# Patient Record
Sex: Female | Born: 1960 | Race: White | Hispanic: No | Marital: Married | Smoking: Never smoker
Health system: Southern US, Community
[De-identification: ages and names within clinical notes are randomized; demographics above are authoritative.]

## PROBLEM LIST (undated history)

## (undated) DIAGNOSIS — N189 Chronic kidney disease, unspecified: Secondary | ICD-10-CM

## (undated) DIAGNOSIS — R112 Nausea with vomiting, unspecified: Secondary | ICD-10-CM

## (undated) DIAGNOSIS — IMO0001 Reserved for inherently not codable concepts without codable children: Secondary | ICD-10-CM

## (undated) DIAGNOSIS — K219 Gastro-esophageal reflux disease without esophagitis: Secondary | ICD-10-CM

## (undated) DIAGNOSIS — Z9889 Other specified postprocedural states: Secondary | ICD-10-CM

## (undated) DIAGNOSIS — G4730 Sleep apnea, unspecified: Secondary | ICD-10-CM

## (undated) DIAGNOSIS — R200 Anesthesia of skin: Secondary | ICD-10-CM

## (undated) DIAGNOSIS — Z8489 Family history of other specified conditions: Secondary | ICD-10-CM

## (undated) DIAGNOSIS — Z8709 Personal history of other diseases of the respiratory system: Secondary | ICD-10-CM

## (undated) DIAGNOSIS — R002 Palpitations: Secondary | ICD-10-CM

## (undated) DIAGNOSIS — I499 Cardiac arrhythmia, unspecified: Secondary | ICD-10-CM

## (undated) DIAGNOSIS — Z973 Presence of spectacles and contact lenses: Secondary | ICD-10-CM

## (undated) DIAGNOSIS — R202 Paresthesia of skin: Secondary | ICD-10-CM

## (undated) DIAGNOSIS — J302 Other seasonal allergic rhinitis: Secondary | ICD-10-CM

## (undated) DIAGNOSIS — Z8719 Personal history of other diseases of the digestive system: Secondary | ICD-10-CM

## (undated) DIAGNOSIS — Z9689 Presence of other specified functional implants: Secondary | ICD-10-CM

## (undated) DIAGNOSIS — E119 Type 2 diabetes mellitus without complications: Secondary | ICD-10-CM

## (undated) DIAGNOSIS — H9192 Unspecified hearing loss, left ear: Secondary | ICD-10-CM

## (undated) HISTORY — PX: ROTATOR CUFF REPAIR: SHX139

## (undated) HISTORY — PX: BACK SURGERY: SHX140

## (undated) HISTORY — PX: COLONOSCOPY: SHX174

## (undated) HISTORY — PX: PLANTAR FASCIA SURGERY: SHX746

## (undated) HISTORY — PX: ABDOMINAL HYSTERECTOMY: SHX81

## (undated) HISTORY — DX: Other seasonal allergic rhinitis: J30.2

## (undated) HISTORY — DX: Chronic kidney disease, unspecified: N18.9

## (undated) HISTORY — PX: OTHER SURGICAL HISTORY: SHX169

## (undated) HISTORY — PX: ESOPHAGOGASTRODUODENOSCOPY: SHX1529

---
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
## 1988-09-06 HISTORY — PX: CHOLECYSTECTOMY: SHX55

## 1996-09-06 DIAGNOSIS — Z5189 Encounter for other specified aftercare: Secondary | ICD-10-CM

## 1996-09-06 HISTORY — DX: Encounter for other specified aftercare: Z51.89

## 2003-10-25 ENCOUNTER — Ambulatory Visit (HOSPITAL_BASED_OUTPATIENT_CLINIC_OR_DEPARTMENT_OTHER): Admission: RE | Admit: 2003-10-25 | Discharge: 2003-10-25 | Payer: Self-pay | Admitting: Internal Medicine

## 2005-01-14 ENCOUNTER — Ambulatory Visit (HOSPITAL_COMMUNITY): Admission: RE | Admit: 2005-01-14 | Discharge: 2005-01-14 | Payer: Self-pay | Admitting: Orthopedic Surgery

## 2005-01-14 ENCOUNTER — Ambulatory Visit (HOSPITAL_BASED_OUTPATIENT_CLINIC_OR_DEPARTMENT_OTHER): Admission: RE | Admit: 2005-01-14 | Discharge: 2005-01-14 | Payer: Self-pay | Admitting: Orthopedic Surgery

## 2006-07-27 ENCOUNTER — Emergency Department (HOSPITAL_COMMUNITY): Admission: EM | Admit: 2006-07-27 | Discharge: 2006-07-27 | Payer: Self-pay | Admitting: Emergency Medicine

## 2007-07-27 ENCOUNTER — Emergency Department (HOSPITAL_COMMUNITY): Admission: EM | Admit: 2007-07-27 | Discharge: 2007-07-27 | Payer: Self-pay | Admitting: Emergency Medicine

## 2008-09-09 DIAGNOSIS — E119 Type 2 diabetes mellitus without complications: Secondary | ICD-10-CM | POA: Insufficient documentation

## 2008-09-09 DIAGNOSIS — E1129 Type 2 diabetes mellitus with other diabetic kidney complication: Secondary | ICD-10-CM | POA: Insufficient documentation

## 2008-09-09 DIAGNOSIS — R809 Proteinuria, unspecified: Secondary | ICD-10-CM | POA: Insufficient documentation

## 2008-09-10 ENCOUNTER — Ambulatory Visit: Payer: Self-pay | Admitting: Gastroenterology

## 2008-09-10 DIAGNOSIS — R112 Nausea with vomiting, unspecified: Secondary | ICD-10-CM | POA: Insufficient documentation

## 2008-09-10 DIAGNOSIS — R197 Diarrhea, unspecified: Secondary | ICD-10-CM | POA: Insufficient documentation

## 2008-09-10 DIAGNOSIS — R198 Other specified symptoms and signs involving the digestive system and abdomen: Secondary | ICD-10-CM | POA: Insufficient documentation

## 2008-09-10 DIAGNOSIS — R1013 Epigastric pain: Secondary | ICD-10-CM | POA: Insufficient documentation

## 2008-09-10 DIAGNOSIS — K219 Gastro-esophageal reflux disease without esophagitis: Secondary | ICD-10-CM | POA: Insufficient documentation

## 2008-09-11 LAB — CONVERTED CEMR LAB
ALT: 20 units/L (ref 0–35)
AST: 18 units/L (ref 0–37)
Albumin: 4.3 g/dL (ref 3.5–5.2)
Alkaline Phosphatase: 55 units/L (ref 39–117)
Basophils Absolute: 0.1 10*3/uL (ref 0.0–0.1)
Eosinophils Absolute: 0.2 10*3/uL (ref 0.0–0.7)
GFR calc Af Amer: 100 mL/min
GFR calc non Af Amer: 82 mL/min
Glucose, Bld: 122 mg/dL — ABNORMAL HIGH (ref 70–99)
HCT: 37.6 % (ref 36.0–46.0)
Lipase: 23 units/L (ref 11.0–59.0)
Lymphocytes Relative: 35.7 % (ref 12.0–46.0)
MCHC: 35.2 g/dL (ref 30.0–36.0)
Monocytes Relative: 6 % (ref 3.0–12.0)
Neutro Abs: 5.2 10*3/uL (ref 1.4–7.7)
Neutrophils Relative %: 55.6 % (ref 43.0–77.0)
Platelets: 256 10*3/uL (ref 150–400)
RDW: 12.2 % (ref 11.5–14.6)
Sodium: 141 meq/L (ref 135–145)

## 2008-09-27 ENCOUNTER — Telehealth: Payer: Self-pay | Admitting: Gastroenterology

## 2008-10-11 ENCOUNTER — Encounter: Payer: Self-pay | Admitting: Gastroenterology

## 2008-10-11 ENCOUNTER — Ambulatory Visit: Payer: Self-pay | Admitting: Gastroenterology

## 2008-10-14 ENCOUNTER — Encounter: Payer: Self-pay | Admitting: Gastroenterology

## 2010-12-22 LAB — GLUCOSE, CAPILLARY
Glucose-Capillary: 225 mg/dL — ABNORMAL HIGH (ref 70–99)
Glucose-Capillary: 239 mg/dL — ABNORMAL HIGH (ref 70–99)

## 2011-01-22 NOTE — Op Note (Signed)
Alexis Black, Alexis Black                 ACCOUNT NO.:  [id number]   MEDICAL RECORD NO.:  [id number]          PATIENT TYPE:  AMB   LOCATION:  [facility]                          FACILITY:  [facility]   PHYSICIAN:  Nadara Mustard, MD     DATE OF BIRTH:  07/17/1961   DATE OF PROCEDURE:  01/14/2005  DATE OF DISCHARGE:                                 OPERATIVE REPORT   PREOPERATIVE DIAGNOSIS:  Achilles contracture with plantar fasciitis, right  foot.   POSTOPERATIVE DIAGNOSIS:  Achilles contracture with plantar fasciitis, right  foot.   PROCEDURE:  Gastrocnemius recession, right leg.   SURGEON.:  Nadara Mustard, MD   ANESTHESIA:  Popliteal block plus general tracheal.   ESTIMATED BLOOD LOSS:  Minimal.   ANTIBIOTICS:  None.   DRAINS:  None.   COMPLICATIONS:  None.   DISPOSITION:  To PACU in stable condition.   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 The patient has failed conservative care with  activity modifications and stretching and presents at this time for a  gastrocnemius recession. The patient has dorsiflexion 20 degrees short of  neutral with her knee extended, does have dorsiflexion past neutral with her  knee flexed and presents at this time for the above-mentioned procedure. The  risks and benefits were discussed including infection, neurovascular injury,  recurrent contraction, need for additional surgery, injury of the sural  nerve. The patient states she understands and wished to proceed at this  time.   DESCRIPTION OF PROCEDURE:  The patient was brought to OR room five after a  popliteal block. The patient then underwent general anesthetic. After  adequate level of anesthesia obtained, the patient was placed prone on the  operating table and her right lower extremity was prepped using DuraPrep and  draped in a sterile field. A  2 inch  incision was made approximately a  centimeter proximal to the musculotendinous junction of the Achilles  gastrocnemius complex. Blunt dissection was carried down to the fascia of  the muscle. With the soft tissue protected, the fascia was released. This  was released medially and laterally. The patient went from -40 degrees of  dorsiflexion to +20 degrees of dorsiflexion with over 40 degrees of gain in  range of motion. The wound was irrigated with normal saline. There was no  bleeding. The wound was closed using running 3-0 intracuticular  Monocryl suture and Steri-Strips were used. The wound was covered with 4 x  4s, Adaptic, Kerlix and a Coban dressing. The patient was then transferred  supine, extubated, taken to PACU in stable condition. Plan for progressive  ambulation, weightbearing as tolerated, change of dressing in three days.  Follow-up in office in two weeks.      MVD/MEDQ  D:  01/14/2005  T:  01/14/2005  Job:  [id number]

## 2011-07-01 ENCOUNTER — Observation Stay (HOSPITAL_COMMUNITY)
Admission: EM | Admit: 2011-07-01 | Discharge: 2011-07-02 | Disposition: A | Discharge status: Home / Self Care | Payer: 59 | Attending: Emergency Medicine | Admitting: Emergency Medicine

## 2011-07-01 ENCOUNTER — Emergency Department (HOSPITAL_COMMUNITY): Payer: 59

## 2011-07-01 DIAGNOSIS — E119 Type 2 diabetes mellitus without complications: Secondary | ICD-10-CM | POA: Insufficient documentation

## 2011-07-01 DIAGNOSIS — Z8249 Family history of ischemic heart disease and other diseases of the circulatory system: Secondary | ICD-10-CM | POA: Insufficient documentation

## 2011-07-01 DIAGNOSIS — R0789 Other chest pain: Principal | ICD-10-CM | POA: Insufficient documentation

## 2011-07-01 DIAGNOSIS — I10 Essential (primary) hypertension: Secondary | ICD-10-CM | POA: Insufficient documentation

## 2011-07-01 DIAGNOSIS — E785 Hyperlipidemia, unspecified: Secondary | ICD-10-CM | POA: Insufficient documentation

## 2011-07-01 DIAGNOSIS — F430 Acute stress reaction: Secondary | ICD-10-CM | POA: Insufficient documentation

## 2011-07-01 DIAGNOSIS — Z794 Long term (current) use of insulin: Secondary | ICD-10-CM | POA: Insufficient documentation

## 2011-07-01 DIAGNOSIS — R0602 Shortness of breath: Secondary | ICD-10-CM | POA: Insufficient documentation

## 2011-07-01 LAB — PROTIME-INR
INR: 0.91 (ref 0.00–1.49)
INR: 1 (ref 0.00–1.49)

## 2011-07-01 LAB — COMPREHENSIVE METABOLIC PANEL
ALT: 24 U/L (ref 0–35)
ALT: 20 U/L (ref 0–35)
AST: 33 U/L (ref 0–37)
Albumin: 4.4 g/dL (ref 3.5–5.2)
Alkaline Phosphatase: 67 U/L (ref 39–117)
CO2: 25 mEq/L (ref 19–32)
Calcium: 10.3 mg/dL (ref 8.4–10.5)
Calcium: 9.7 mg/dL (ref 8.4–10.5)
Chloride: 99 mEq/L (ref 96–112)
GFR calc Af Amer: >90 mL/min (ref >90)
GFR calc Af Amer: 90 mL/min — ABNORMAL LOW (ref >90)
GFR calc non Af Amer: 86 mL/min — ABNORMAL LOW (ref >90)
Glucose, Bld: 131 mg/dL — ABNORMAL HIGH (ref 70–99)
Potassium: 4.6 mEq/L (ref 3.5–5.1)
Sodium: 140 mEq/L (ref 135–145)
Total Bilirubin: 0.2 mg/dL — ABNORMAL LOW (ref 0.3–1.2)
Total Protein: 7.5 g/dL (ref 6.0–8.3)

## 2011-07-01 LAB — CBC
HCT: 37.6 % (ref 36.0–46.0)
Hemoglobin: 12.9 g/dL (ref 12.0–15.0)
Hemoglobin: 12.1 g/dL (ref 12.0–15.0)
MCH: 28.5 pg (ref 26.0–34.0)
MCH: 28.5 pg (ref 26.0–34.0)
MCHC: 34.3 g/dL (ref 30.0–36.0)
MCHC: 33.7 g/dL (ref 30.0–36.0)
MCV: 83.2 fL (ref 78.0–100.0)
MCV: 84.5 fL (ref 78.0–100.0)
Platelets: 313 10*3/uL (ref 150–400)
RBC: 4.52 MIL/uL (ref 3.87–5.11)
RBC: 4.25 MIL/uL (ref 3.87–5.11)
RDW: 13.1 % (ref 11.5–15.5)
RDW: 13.1 % (ref 11.5–15.5)

## 2011-07-01 LAB — POCT I-STAT, CHEM 8
Creatinine, Ser: 0.8 mg/dL (ref 0.50–1.10)
TCO2: 24 mmol/L (ref 0–100)

## 2011-07-01 LAB — URINE MICROSCOPIC-ADD ON

## 2011-07-01 LAB — HEMOGLOBIN A1C
Hgb A1c MFr Bld: 8 % — ABNORMAL HIGH (ref <5.7)
Mean Plasma Glucose: 183 mg/dL — ABNORMAL HIGH (ref <117)

## 2011-07-01 LAB — POCT I-STAT TROPONIN I: Troponin i, poc: 0 ng/mL (ref 0.00–0.08)

## 2011-07-01 LAB — URINALYSIS, ROUTINE W REFLEX MICROSCOPIC
Bilirubin Urine: NEGATIVE
Hgb urine dipstick: NEGATIVE
Ketones, ur: NEGATIVE mg/dL
Nitrite: NEGATIVE

## 2011-07-01 LAB — DIFFERENTIAL
Monocytes Relative: 5 % (ref 3–12)
Neutro Abs: 6 10*3/uL (ref 1.7–7.7)

## 2011-07-01 LAB — CK TOTAL AND CKMB (NOT AT ARMC): Total CK: 57 U/L (ref 7–177)

## 2011-07-01 LAB — PRO B NATRIURETIC PEPTIDE: Pro B Natriuretic peptide (BNP): 5.1 pg/mL (ref 0–125)

## 2011-07-01 LAB — GLUCOSE, CAPILLARY: Glucose-Capillary: 178 mg/dL — ABNORMAL HIGH (ref 70–99)

## 2011-07-01 LAB — D-DIMER, QUANTITATIVE: D-Dimer, Quant: 0.33 ug/mL-FEU (ref 0.00–0.48)

## 2011-07-01 LAB — TSH: TSH: 1.518 u[IU]/mL (ref 0.350–4.500)

## 2011-07-02 ENCOUNTER — Observation Stay (HOSPITAL_COMMUNITY): Payer: 59

## 2011-07-02 LAB — CARDIAC PANEL(CRET KIN+CKTOT+MB+TROPI)
Relative Index: INVALID (ref 0.0–2.5)
Total CK: 57 U/L (ref 7–177)
Troponin I: <0.3 ng/mL (ref <0.30)
Troponin I: <0.3 ng/mL (ref <0.30)

## 2011-07-02 LAB — GLUCOSE, CAPILLARY: Glucose-Capillary: 241 mg/dL — ABNORMAL HIGH (ref 70–99)

## 2011-07-02 LAB — LIPID PANEL: HDL: 38 mg/dL — ABNORMAL LOW (ref >39)

## 2011-07-02 MED ORDER — TECHNETIUM TC 99M TETROFOSMIN IV KIT
30.0000 | PACK | Freq: Once | INTRAVENOUS | Status: AC | PRN
  Administered 2011-07-02: 30 via INTRAVENOUS

## 2011-07-02 MED ORDER — TECHNETIUM TC 99M TETROFOSMIN IV KIT
10.0000 | PACK | Freq: Once | INTRAVENOUS | Status: AC | PRN
  Administered 2011-07-02: 10 via INTRAVENOUS
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
## 2011-10-28 ENCOUNTER — Encounter: Payer: Self-pay | Admitting: Gastroenterology

## 2012-06-20 ENCOUNTER — Encounter: Payer: Self-pay | Admitting: Gastroenterology

## 2012-11-19 ENCOUNTER — Emergency Department (HOSPITAL_COMMUNITY)
Admission: EM | Admit: 2012-11-19 | Discharge: 2012-11-19 | Disposition: A | Discharge status: Home / Self Care | Payer: No Typology Code available for payment source | Attending: Emergency Medicine | Admitting: Emergency Medicine

## 2012-11-19 ENCOUNTER — Encounter (HOSPITAL_COMMUNITY): Payer: Self-pay | Admitting: Physical Medicine and Rehabilitation

## 2012-11-19 DIAGNOSIS — R112 Nausea with vomiting, unspecified: Secondary | ICD-10-CM

## 2012-11-19 DIAGNOSIS — Z79899 Other long term (current) drug therapy: Secondary | ICD-10-CM | POA: Insufficient documentation

## 2012-11-19 DIAGNOSIS — R5381 Other malaise: Secondary | ICD-10-CM | POA: Insufficient documentation

## 2012-11-19 DIAGNOSIS — E119 Type 2 diabetes mellitus without complications: Secondary | ICD-10-CM | POA: Insufficient documentation

## 2012-11-19 DIAGNOSIS — E860 Dehydration: Secondary | ICD-10-CM

## 2012-11-19 DIAGNOSIS — Z8679 Personal history of other diseases of the circulatory system: Secondary | ICD-10-CM | POA: Insufficient documentation

## 2012-11-19 DIAGNOSIS — R42 Dizziness and giddiness: Secondary | ICD-10-CM | POA: Insufficient documentation

## 2012-11-19 DIAGNOSIS — Z794 Long term (current) use of insulin: Secondary | ICD-10-CM | POA: Insufficient documentation

## 2012-11-19 DIAGNOSIS — R5383 Other fatigue: Secondary | ICD-10-CM | POA: Insufficient documentation

## 2012-11-19 HISTORY — DX: Type 2 diabetes mellitus without complications: E11.9

## 2012-11-19 HISTORY — DX: Cardiac arrhythmia, unspecified: I49.9

## 2012-11-19 LAB — COMPREHENSIVE METABOLIC PANEL
Albumin: 4.1 g/dL (ref 3.5–5.2)
Alkaline Phosphatase: 80 U/L (ref 39–117)
BUN: 25 mg/dL — ABNORMAL HIGH (ref 6–23)
Calcium: 9.8 mg/dL (ref 8.4–10.5)
Creatinine, Ser: 0.92 mg/dL (ref 0.50–1.10)
GFR calc Af Amer: 83 mL/min — ABNORMAL LOW (ref >90)
Glucose, Bld: 195 mg/dL — ABNORMAL HIGH (ref 70–99)
Total Protein: 8 g/dL (ref 6.0–8.3)

## 2012-11-19 LAB — URINALYSIS, ROUTINE W REFLEX MICROSCOPIC
Ketones, ur: 15 mg/dL — AB
Nitrite: NEGATIVE
Specific Gravity, Urine: 1.021 (ref 1.005–1.030)
Urobilinogen, UA: 0.2 mg/dL (ref 0.0–1.0)
pH: 5.5 (ref 5.0–8.0)

## 2012-11-19 LAB — CBC WITH DIFFERENTIAL/PLATELET
Basophils Relative: 0 % (ref 0–1)
Eosinophils Absolute: 0.2 10*3/uL (ref 0.0–0.7)
Eosinophils Relative: 1 % (ref 0–5)
HCT: 38.7 % (ref 36.0–46.0)
Hemoglobin: 13.5 g/dL (ref 12.0–15.0)
Lymphs Abs: 0.7 10*3/uL (ref 0.7–4.0)
MCH: 29.1 pg (ref 26.0–34.0)
MCHC: 34.9 g/dL (ref 30.0–36.0)
MCV: 83.4 fL (ref 78.0–100.0)
Monocytes Absolute: 0.6 10*3/uL (ref 0.1–1.0)
Monocytes Relative: 4 % (ref 3–12)
Neutrophils Relative %: 91 % — ABNORMAL HIGH (ref 43–77)
RBC: 4.64 MIL/uL (ref 3.87–5.11)

## 2012-11-19 LAB — LIPASE, BLOOD: Lipase: 42 U/L (ref 11–59)

## 2012-11-19 LAB — URINE MICROSCOPIC-ADD ON

## 2012-11-19 MED ORDER — SODIUM CHLORIDE 0.9 % IV BOLUS (SEPSIS)
1000.0000 mL | Freq: Once | INTRAVENOUS | Status: AC
  Administered 2012-11-19: 1000 mL via INTRAVENOUS

## 2012-11-19 MED ORDER — PROMETHAZINE HCL 25 MG RE SUPP: 25.0000 mg | Freq: Four times a day (QID) | RECTAL | Status: DC | PRN

## 2012-11-19 MED ORDER — PROMETHAZINE HCL 25 MG/ML IJ SOLN
25.0000 mg | INTRAMUSCULAR | Status: AC
  Administered 2012-11-19: 25 mg via INTRAVENOUS
  Filled 2012-11-19: qty 1

## 2012-11-19 MED ORDER — ONDANSETRON 4 MG PO TBDP
8.0000 mg | ORAL_TABLET | Freq: Once | ORAL | Status: AC
  Administered 2012-11-19: 8 mg via ORAL
  Filled 2012-11-19: qty 2

## 2012-11-19 MED ORDER — DIPHENHYDRAMINE HCL 50 MG/ML IJ SOLN
25.0000 mg | Freq: Once | INTRAMUSCULAR | Status: AC
  Administered 2012-11-19: 25 mg via INTRAVENOUS
  Filled 2012-11-19: qty 1

## 2012-11-19 MED ORDER — SODIUM CHLORIDE 0.9 % IV SOLN
1000.0000 mL | Freq: Once | INTRAVENOUS | Status: AC
  Administered 2012-11-19: 1000 mL via INTRAVENOUS

## 2012-11-19 MED ORDER — PROMETHAZINE HCL 25 MG/ML IJ SOLN: 25.0000 mg | Freq: Once | INTRAMUSCULAR | Status: DC

## 2012-11-19 MED ORDER — METOCLOPRAMIDE HCL 5 MG/ML IJ SOLN
10.0000 mg | Freq: Once | INTRAMUSCULAR | Status: AC
  Administered 2012-11-19: 10 mg via INTRAVENOUS
  Filled 2012-11-19: qty 2

## 2012-11-19 MED ORDER — SODIUM CHLORIDE 0.9 % IV SOLN: 1000.0000 mL | INTRAVENOUS | Status: DC

## 2012-11-19 MED ORDER — PROMETHAZINE HCL 25 MG PO TABS: 25.0000 mg | ORAL_TABLET | Freq: Three times a day (TID) | ORAL | Status: DC | PRN

## 2012-11-19 MED ORDER — ONDANSETRON HCL 4 MG/2ML IJ SOLN
4.0000 mg | Freq: Once | INTRAMUSCULAR | Status: DC
  Filled 2012-11-19: qty 2

## 2012-11-19 MED ORDER — PROMETHAZINE HCL 25 MG/ML IJ SOLN
25.0000 mg | Freq: Once | INTRAMUSCULAR | Status: AC
  Administered 2012-11-19: 25 mg via INTRAMUSCULAR
  Filled 2012-11-19: qty 1

## 2012-11-19 NOTE — ED Notes (Signed)
Pt presents to department for evaluation of nausea/vomiting and diffuse abdominal pain. Onset @9 :00pm this morning. States she is unable to keep food/fluids down. Actively vomiting in triage. She is alert and oriented x4.

## 2012-11-19 NOTE — ED Provider Notes (Addendum)
History     CSN: [id number]  Arrival date & time 11/19/12  1409   First MD Initiated Contact with Patient 11/19/12 1433      Chief Complaint  Patient presents with  . Emesis  . Abdominal Pain    (Consider location/radiation/quality/duration/timing/severity/associated sxs/prior treatment) HPI  Patient relates about 9:30 this morning she had acute onset of nausea and vomiting without diarrhea. She is states she's vomited at least 20 times. She states she has epigastric abdominal pain that she describes as aching. She denies being around anybody else is sick or eating anything that made it made her feel bad. She states she feels weak and she has some lightheaded and this and dizziness when she stands.  PCP Dr Jarome Matin  Past Medical History  Diagnosis Date  . Irregular heart beat   . Diabetes mellitus without complication     No past surgical history on file.  No family history on file.  History  Substance Use Topics  . Smoking status: Never Smoker   . Smokeless tobacco: Not on file  . Alcohol Use: No  lives with spouse  OB History   Grav Para Term Preterm Abortions TAB SAB Ect Mult Living                  Review of Systems  All other systems reviewed and are negative.    Allergies  Review of patient's allergies indicates no known allergies.  Home Medications   Current Outpatient Rx  Name  Route  Sig  Dispense  Refill  . amphetamine-dextroamphetamine (ADDERALL XR) 20 MG 24 hr capsule   Oral   Take 20 mg by mouth every morning.         Marland Kitchen buPROPion (WELLBUTRIN XL) 300 MG 24 hr tablet   Oral   Take 300 mg by mouth daily.         Marland Kitchen gemfibrozil (LOPID) 600 MG tablet   Oral   Take 600 mg by mouth 2 (two) times daily before a meal.         . insulin aspart (NOVOLOG FLEXPEN) 100 UNIT/ML injection   Subcutaneous   Inject 20-30 Units into the skin 3 (three) times daily before meals. Sliding scale as directed         . insulin detemir (LEVEMIR  FLEXPEN) 100 UNIT/ML injection   Subcutaneous   Inject 50 Units into the skin 2 (two) times daily.         . metFORMIN (GLUMETZA) 500 MG (MOD) 24 hr tablet   Oral   Take 1,000 mg by mouth 2 (two) times daily with a meal.         . metoprolol tartrate (LOPRESSOR) 25 MG tablet   Oral   Take 25 mg by mouth daily.         . RABEprazole (ACIPHEX) 20 MG tablet   Oral   Take 20 mg by mouth daily.         . valsartan-hydrochlorothiazide (DIOVAN-HCT) 160-12.5 MG per tablet   Oral   Take 1 tablet by mouth daily.           BP 126/61  Pulse 89  Temp(Src) 97.7 F (36.5 C) (Oral)  Resp 18  SpO2 100%  Vital signs normal    Physical Exam  Nursing note and vitals reviewed. Constitutional: She is oriented to person, place, and time. She appears well-developed and well-nourished.  Non-toxic appearance. She does not appear ill. No distress.  HENT:  Head: Normocephalic  and atraumatic.  Right Ear: External ear normal.  Left Ear: External ear normal.  Nose: Nose normal. No mucosal edema or rhinorrhea.  Mouth/Throat: Mucous membranes are normal. No dental abscesses or edematous.  Dry tongue  Eyes: Conjunctivae and EOM are normal. Pupils are equal, round, and reactive to light.  Neck: Normal range of motion and full passive range of motion without pain. Neck supple.  Cardiovascular: Normal rate, regular rhythm and normal heart sounds.  Exam reveals no gallop and no friction rub.   No murmur heard. Pulmonary/Chest: Effort normal and breath sounds normal. No respiratory distress. She has no wheezes. She has no rhonchi. She has no rales. She exhibits no tenderness and no crepitus.  Abdominal: Soft. Normal appearance and bowel sounds are normal. She exhibits no distension. There is generalized tenderness. There is no rebound and no guarding.  Musculoskeletal: Normal range of motion. She exhibits no edema and no tenderness.  Moves all extremities well.   Neurological: She is alert and  oriented to person, place, and time. She has normal strength. No cranial nerve deficit.  Skin: Skin is warm, dry and intact. No rash noted. No erythema. There is pallor.  Psychiatric: She has a normal mood and affect. Her speech is normal and behavior is normal. Her mood appears not anxious.    ED Course  Procedures (including critical care time) Medications  0.9 %  sodium chloride infusion (0 mLs Intravenous Stopped 11/19/12 1713)    Followed by  0.9 %  sodium chloride infusion (0 mLs Intravenous Stopped 11/19/12 1826)    Followed by  0.9 %  sodium chloride infusion (not administered)  promethazine (PHENERGAN) injection 25 mg (not administered)  promethazine (PHENERGAN) injection 25 mg (not administered)  ondansetron (ZOFRAN-ODT) disintegrating tablet 8 mg (8 mg Oral Given 11/19/12 1426)  metoCLOPramide (REGLAN) injection 10 mg (10 mg Intravenous Given 11/19/12 1606)  diphenhydrAMINE (BENADRYL) injection 25 mg (25 mg Intravenous Given 11/19/12 1606)  promethazine (PHENERGAN) injection 25 mg (25 mg Intravenous Given 11/19/12 1606)  sodium chloride 0.9 % bolus 1,000 mL (0 mLs Intravenous Stopped 11/19/12 1944)   Patient received IV fluids and nausea medication. At 1800 she states she feels like she is able to start trying to drink fluids.  20:00 has had urine output, has drank some fluids without vomiting, feels ready to go home.   22:16 pt started dry heaving once IV removed, Given phenergan IM.   Results for orders placed during the hospital encounter of 11/19/12  CBC WITH DIFFERENTIAL      Result Value Range   WBC 16.2 (*) 4.0 - 10.5 K/uL   RBC 4.64  3.87 - 5.11 MIL/uL   Hemoglobin 13.5  12.0 - 15.0 g/dL   HCT 16.1  09.6 - 04.5 %   MCV 83.4  78.0 - 100.0 fL   MCH 29.1  26.0 - 34.0 pg   MCHC 34.9  30.0 - 36.0 g/dL   RDW 40.9  81.1 - 91.4 %   Platelets 358  150 - 400 K/uL   Neutrophils Relative 91 (*) 43 - 77 %   Neutro Abs 14.8 (*) 1.7 - 7.7 K/uL   Lymphocytes Relative 4 (*) 12 -  46 %   Lymphs Abs 0.7  0.7 - 4.0 K/uL   Monocytes Relative 4  3 - 12 %   Monocytes Absolute 0.6  0.1 - 1.0 K/uL   Eosinophils Relative 1  0 - 5 %   Eosinophils Absolute 0.2  0.0 - 0.7  K/uL   Basophils Relative 0  0 - 1 %   Basophils Absolute 0.0  0.0 - 0.1 K/uL  COMPREHENSIVE METABOLIC PANEL      Result Value Range   Sodium 136  135 - 145 mEq/L   Potassium 4.9  3.5 - 5.1 mEq/L   Chloride 96  96 - 112 mEq/L   CO2 20  19 - 32 mEq/L   Glucose, Bld 195 (*) 70 - 99 mg/dL   BUN 25 (*) 6 - 23 mg/dL   Creatinine, Ser 7.82  0.50 - 1.10 mg/dL   Calcium 9.8  8.4 - 95.6 mg/dL   Total Protein 8.0  6.0 - 8.3 g/dL   Albumin 4.1  3.5 - 5.2 g/dL   AST 14  0 - 37 U/L   ALT 13  0 - 35 U/L   Alkaline Phosphatase 80  39 - 117 U/L   Total Bilirubin 0.3  0.3 - 1.2 mg/dL   GFR calc non Af Amer 72 (*) >90 mL/min   GFR calc Af Amer 83 (*) >90 mL/min  LIPASE, BLOOD      Result Value Range   Lipase 42  11 - 59 U/L  URINALYSIS, ROUTINE W REFLEX MICROSCOPIC      Result Value Range   Color, Urine YELLOW  YELLOW   APPearance CLEAR  CLEAR   Specific Gravity, Urine 1.021  1.005 - 1.030   pH 5.5  5.0 - 8.0   Glucose, UA NEGATIVE  NEGATIVE mg/dL   Hgb urine dipstick NEGATIVE  NEGATIVE   Bilirubin Urine SMALL (*) NEGATIVE   Ketones, ur 15 (*) NEGATIVE mg/dL   Protein, ur NEGATIVE  NEGATIVE mg/dL   Urobilinogen, UA 0.2  0.0 - 1.0 mg/dL   Nitrite NEGATIVE  NEGATIVE   Leukocytes, UA SMALL (*) NEGATIVE  GLUCOSE, CAPILLARY      Result Value Range   Glucose-Capillary 181 (*) 70 - 99 mg/dL  URINE MICROSCOPIC-ADD ON      Result Value Range   Squamous Epithelial / LPF FEW (*) RARE   WBC, UA 3-6  <3 WBC/hpf   Laboratory interpretation all normal except hyperglycemia    1. Nausea and vomiting   2. Dehydration     New Prescriptions   PROMETHAZINE (PHENERGAN) 25 MG SUPPOSITORY    Place 1 suppository (25 mg total) rectally every 6 (six) hours as needed for nausea.   PROMETHAZINE (PHENERGAN) 25 MG TABLET     Take 1 tablet (25 mg total) by mouth every 8 (eight) hours as needed for nausea.    Plan discharge  Devoria Albe, MD, FACEP   MDM          Ward Givens, MD 11/19/12 Rosamaria Lints  Ward Givens, MD 11/19/12 2017

## 2012-11-19 NOTE — ED Notes (Signed)
Went into see pt. And start and IV to give medications and fluids.  Husband reported that pt. Is very sensitive to IVs.  Explained to pt. That we will do out best and be very caring.  Pt. 's husband asked if we numb the site prior to inserting a Heplock,  Explained to pt. That we do not.  Pt.s husband became very upset and raised his voice at me and stated, "You do not need to talk to me like that. I have been in the hospital many times and I know that hospitals do numb.  I appoligized  And told him that I would call IV nurse.  Spoke with the IV nurse we do not numb pts prior to iv insertions.  Dr. Devoria Albe also  Explained to pt.  Pt. Very angry and rude with staff.

## 2013-12-03 ENCOUNTER — Other Ambulatory Visit: Payer: Self-pay | Admitting: Internal Medicine

## 2013-12-03 DIAGNOSIS — R1310 Dysphagia, unspecified: Secondary | ICD-10-CM

## 2013-12-05 ENCOUNTER — Other Ambulatory Visit: Payer: 59

## 2015-03-05 ENCOUNTER — Other Ambulatory Visit: Payer: Self-pay | Admitting: Obstetrics and Gynecology

## 2015-03-06 LAB — CYTOLOGY - PAP

## 2016-04-23 ENCOUNTER — Ambulatory Visit: Payer: Self-pay | Admitting: Physician Assistant

## 2016-05-06 ENCOUNTER — Encounter (HOSPITAL_COMMUNITY): Payer: Self-pay | Admitting: General Practice

## 2016-05-06 ENCOUNTER — Encounter (HOSPITAL_COMMUNITY)
Admission: RE | Admit: 2016-05-06 | Discharge: 2016-05-06 | Disposition: A | Discharge status: Home / Self Care | Payer: 59 | Source: Ambulatory Visit | Attending: Orthopedic Surgery | Admitting: Orthopedic Surgery

## 2016-05-06 DIAGNOSIS — Z01818 Encounter for other preprocedural examination: Secondary | ICD-10-CM | POA: Insufficient documentation

## 2016-05-06 DIAGNOSIS — G8929 Other chronic pain: Secondary | ICD-10-CM | POA: Diagnosis not present

## 2016-05-06 DIAGNOSIS — E119 Type 2 diabetes mellitus without complications: Secondary | ICD-10-CM | POA: Diagnosis not present

## 2016-05-06 DIAGNOSIS — Z01812 Encounter for preprocedural laboratory examination: Secondary | ICD-10-CM | POA: Insufficient documentation

## 2016-05-06 HISTORY — DX: Gastro-esophageal reflux disease without esophagitis: K21.9

## 2016-05-06 HISTORY — DX: Reserved for inherently not codable concepts without codable children: IMO0001

## 2016-05-06 HISTORY — DX: Palpitations: R00.2

## 2016-05-06 HISTORY — DX: Unspecified hearing loss, left ear: H91.92

## 2016-05-06 HISTORY — DX: Other specified postprocedural states: R11.2

## 2016-05-06 HISTORY — DX: Family history of other specified conditions: Z84.89

## 2016-05-06 HISTORY — DX: Anesthesia of skin: R20.0

## 2016-05-06 HISTORY — DX: Paresthesia of skin: R20.2

## 2016-05-06 HISTORY — DX: Other specified postprocedural states: Z98.890

## 2016-05-06 HISTORY — DX: Sleep apnea, unspecified: G47.30

## 2016-05-06 HISTORY — DX: Personal history of other diseases of the respiratory system: Z87.09

## 2016-05-06 LAB — CBC
HCT: 34 % — ABNORMAL LOW (ref 36.0–46.0)
Hemoglobin: 11 g/dL — ABNORMAL LOW (ref 12.0–15.0)
MCH: 27.9 pg (ref 26.0–34.0)
MCHC: 32.4 g/dL (ref 30.0–36.0)
MCV: 86.3 fL (ref 78.0–100.0)
Platelets: 323 10*3/uL (ref 150–400)
RBC: 3.94 MIL/uL (ref 3.87–5.11)
RDW: 13.2 % (ref 11.5–15.5)
WBC: 10.4 10*3/uL (ref 4.0–10.5)

## 2016-05-06 LAB — BASIC METABOLIC PANEL
Anion gap: 11 (ref 5–15)
BUN: 23 mg/dL — AB (ref 6–20)
CALCIUM: 10.3 mg/dL (ref 8.9–10.3)
CO2: 26 mmol/L (ref 22–32)
CREATININE: 1.12 mg/dL — AB (ref 0.44–1.00)
Chloride: 102 mmol/L (ref 101–111)
GFR calc non Af Amer: 55 mL/min — ABNORMAL LOW (ref >60)
Glucose, Bld: 136 mg/dL — ABNORMAL HIGH (ref 65–99)
Potassium: 4.2 mmol/L (ref 3.5–5.1)
SODIUM: 139 mmol/L (ref 135–145)

## 2016-05-06 LAB — SURGICAL PCR SCREEN
MRSA, PCR: NEGATIVE
STAPHYLOCOCCUS AUREUS: NEGATIVE

## 2016-05-06 LAB — GLUCOSE, CAPILLARY: GLUCOSE-CAPILLARY: 162 mg/dL — AB (ref 65–99)

## 2016-05-06 NOTE — Pre-Procedure Instructions (Signed)
Alexis Black  05/06/2016      [id number] Alexis Black, [address] [place] [address] Phone: [phone] Fax: [phone]    Your procedure is scheduled on Thursday, September 7th, 2017.  Report to [facility] Admitting at 10:30 A.M.   Call this number if you have problems the morning of surgery:  [phone]   Remember:  Do not eat food or drink liquids after midnight.   Take these medicines the morning of surgery with A SIP OF WATER: Metoprolol Tartrate (Lopressor), Rabeprazole (Aciphex).    Stop taking: Aspirin, NSAIDS, Aleve, Naproxen, Ibuprofen, Advil, Motrin, BC's, Goody's, Fish oil, all herbal medications, and all vitamins.    WHAT DO I DO ABOUT MY DIABETES MEDICATION?  Marland Kitchen Do not take oral diabetes medicines (pills) the morning of surgery.  Do NOT take Metformin (Glumetza) the morning of surgery.   . THE NIGHT BEFORE SURGERY, take 22 units of Levemir insulin.      . THE MORNING OF SURGERY, take 22 units of Levemir insulin.  . The day of surgery, do not take other diabetes injectables, including Byetta (exenatide), Bydureon (exenatide ER), Victoza (liraglutide), or Trulicity (dulaglutide).  . If your CBG is greater than 220 mg/dL, you may take  of your sliding scale (correction) dose of insulin.  If your blood sugar is greater than 220, you may take 1/2 of your sliding scale, Novolog.      How to Manage Your Diabetes Before and After Surgery  Why is it important to control my blood sugar before and after surgery? . Improving blood sugar levels before and after surgery helps healing and can limit problems. . A way of improving blood sugar control is eating a healthy diet by: o  Eating less sugar and carbohydrates o  Increasing activity/exercise o  Talking with your doctor about reaching your blood sugar goals . High blood sugars (greater than 180 mg/dL) can raise your risk of infections and slow your recovery,  so you will need to focus on controlling your diabetes during the weeks before surgery. . Make sure that the doctor who takes care of your diabetes knows about your planned surgery including the date and location.  How do I manage my blood sugar before surgery? . Check your blood sugar at least 4 times a day, starting 2 days before surgery, to make sure that the level is not too high or low. o Check your blood sugar the morning of your surgery when you wake up and every 2 hours until you get to the Short Stay unit. . If your blood sugar is less than 70 mg/dL, you will need to treat for low blood sugar: o Do not take insulin. o Treat a low blood sugar (less than 70 mg/dL) with  cup of clear juice (cranberry or apple), 4 glucose tablets, OR glucose gel. o Recheck blood sugar in 15 minutes after treatment (to make sure it is greater than 70 mg/dL). If your blood sugar is not greater than 70 mg/dL on recheck, call [phone] for further instructions. . Report your blood sugar to the short stay nurse when you get to Short Stay.  . If you are admitted to the hospital after surgery: o Your blood sugar will be checked by the staff and you will probably be given insulin after surgery (instead of oral diabetes medicines) to make sure you have good blood sugar levels. o The goal for blood  sugar control after surgery is 80-180 mg/dL.     Do not wear jewelry, make-up or nail polish.  Do not wear lotions, powders, or perfumes, or deoderant.  Do not shave 48 hours prior to surgery.    Do not bring valuables to the hospital.  [facility] is not responsible for any belongings or valuables.  Contacts, dentures or bridgework may not be worn into surgery.  Leave your suitcase in the car.  After surgery it may be brought to your room.  For patients admitted to the hospital, discharge time will be determined by your treatment team.  Patients discharged the day of surgery will not be allowed to drive home.    Special instructions:  Preparing for Surgery.   Please read over the following fact sheets that you were given. MRSA Information    [place]- Preparing For Surgery  Before surgery, you can play an important role. Because skin is not sterile, your skin needs to be as free of germs as possible. You can reduce the number of germs on your skin by washing with CHG (chlorahexidine gluconate) Soap before surgery.  CHG is an antiseptic cleaner which kills germs and bonds with the skin to continue killing germs even after washing.  Please do not use if you have an allergy to CHG or antibacterial soaps. If your skin becomes reddened/irritated stop using the CHG.  Do not shave (including legs and underarms) for at least 48 hours prior to first CHG shower. It is OK to shave your face.  Please follow these instructions carefully.   1. Shower the NIGHT BEFORE SURGERY and the MORNING OF SURGERY with CHG.   2. If you chose to wash your hair, wash your hair first as usual with your normal shampoo.  3. After you shampoo, rinse your hair and body thoroughly to remove the shampoo.  4. Use CHG as you would any other liquid soap. You can apply CHG directly to the skin and wash gently with a scrungie or a clean washcloth.   5. Apply the CHG Soap to your body ONLY FROM THE NECK DOWN.  Do not use on open wounds or open sores. Avoid contact with your eyes, ears, mouth and genitals (private parts). Wash genitals (private parts) with your normal soap.  6. Wash thoroughly, paying special attention to the area where your surgery will be performed.  7. Thoroughly rinse your body with warm water from the neck down.  8. DO NOT shower/wash with your normal soap after using and rinsing off the CHG Soap.  9. Pat yourself dry with a CLEAN TOWEL.   10. Wear CLEAN PAJAMAS   11. Place CLEAN SHEETS on your bed the night of your first shower and DO NOT SLEEP WITH PETS.  Day of Surgery: Do not apply any  deodorants/lotions. Please wear clean clothes to the hospital/surgery center.

## 2016-05-06 NOTE — Progress Notes (Signed)
PCP - Dr. Leanna Battles Cardiologist - denies  EKG - 05/06/16 CXR - denies Echo - > 10 years ago - states its was normal Stress test - 2012 Cardiac Cath - denies  Patient denies chest pain and shortness of breath at PAT appointment.    Patient had an A1C on 04/09/16 - 6.6 (Care Everywhere).  Patient informed nurse that she checks her blood sugar 3-4 times a day and that her fasting glucose is usually 120-135 but she has had her blood sugar drop to 60's in the morning.   Patient educated on how to treat a low blood sugar the day of surgery.

## 2016-05-07 NOTE — Progress Notes (Signed)
Anesthesia chart review: Patient is a 55 year old female scheduled for placement of spinal cord stimulator on 05/13/2016 by Dr. Rolena Infante.  History includes never smoker, irregular heart beat/palpitations ("with caffeine"), left ear deafness, post-operative N/V, DM2, OSA (no CPAP), GERD, hysterectomy, back surgery, cholecystectomy. BMI is consistent with obesity.   PCP is Dr. Leanna Battles. Endocrinologist is Dr. Lind Guest [facility]). She is not followed by a cardiologist, but was admitted by Dr. Sallyanne Kuster in 06/2011 for chest pain and had a non-ischemic stress test. (She had seen Dr. Gwenlyn Found years prior with reported normal echo and Myoview.) PRN cardiology follow-up with Dr. Gwenlyn Found recommended.  Meds include Trulicity, Lopid, NovoLog, Levemir, metformin, Lopressor, AcipHex, valsartan-HCTZ.  BP 127/71   Pulse 78   Temp 36.8 C   Resp 20   Ht 5\' 7"  (1.702 m)   Wt 197 lb 14.4 oz (89.8 kg)   SpO2 99%   BMI 31.00 kg/m    05/06/16 EKG: NSR. Stress test in 2012 (see below). Denied history of cardiac cath. Reported normal echo > 10 years ago.   07/02/11 Nuclear stress test: IMPRESSION: No evidence for pharmacologically induced ischemia. Ejection fraction is 57% with normal wall motion.  Preoperative labs noted. Cr 1.12, glucose 136, H/H 11.0/34.0. A1c 6.6 on 04/09/16 (Care Everywhere).  If no acute changes then I anticipate that she can proceed as planned.  [facility] Short Stay Center/Anesthesiology Phone [phone] 05/07/2016 12:18 PM

## 2016-05-12 MED ORDER — CEFAZOLIN SODIUM-DEXTROSE 2-4 GM/100ML-% IV SOLN
2.0000 g | INTRAVENOUS | Status: AC
  Administered 2016-05-13: 2 g via INTRAVENOUS
  Filled 2016-05-12: qty 100

## 2016-05-12 NOTE — Anesthesia Preprocedure Evaluation (Signed)
Anesthesia Evaluation  Patient identified by MRN, date of birth, ID band Patient awake    Reviewed: Allergy & Precautions, H&P , Patient's Chart, lab work & pertinent test results, reviewed documented beta blocker date and time   Airway Mallampati: II  TM Distance: >3 FB Neck ROM: full    Dental no notable dental hx.    Pulmonary    Pulmonary exam normal breath sounds clear to auscultation       Cardiovascular hypertension,  Rhythm:regular Rate:Normal     Neuro/Psych    GI/Hepatic GERD  ,  Endo/Other  diabetes, Insulin Dependent  Renal/GU      Musculoskeletal   Abdominal   Peds  Hematology   Anesthesia Other Findings Good LV fxn; No ischemia  Reproductive/Obstetrics                             Anesthesia Physical Anesthesia Plan  ASA: III  Anesthesia Plan: General   Post-op Pain Management:    Induction: Intravenous  Airway Management Planned: Oral ETT  Additional Equipment:   Intra-op Plan:   Post-operative Plan: Extubation in OR  Informed Consent: I have reviewed the patients History and Physical, chart, labs and discussed the procedure including the risks, benefits and alternatives for the proposed anesthesia with the patient or authorized representative who has indicated his/her understanding and acceptance.   Dental Advisory Given and Dental advisory given  Plan Discussed with: CRNA and Surgeon  Anesthesia Plan Comments: (  Discussed general anesthesia, including possible nausea, instrumentation of airway, sore throat,pulmonary aspiration, etc. I asked if the were any outstanding questions, or  concerns before we proceeded. )        Anesthesia Quick Evaluation

## 2016-05-13 ENCOUNTER — Observation Stay (HOSPITAL_COMMUNITY)
Admission: RE | Admit: 2016-05-13 | Discharge: 2016-05-14 | Disposition: A | Discharge status: Home / Self Care | Payer: 59 | Source: Ambulatory Visit | Attending: Orthopedic Surgery | Admitting: Orthopedic Surgery

## 2016-05-13 ENCOUNTER — Ambulatory Visit (HOSPITAL_COMMUNITY): Payer: 59 | Admitting: Certified Registered"

## 2016-05-13 ENCOUNTER — Ambulatory Visit (HOSPITAL_COMMUNITY): Payer: 59 | Admitting: Vascular Surgery

## 2016-05-13 ENCOUNTER — Encounter (HOSPITAL_COMMUNITY)
Admission: RE | Disposition: A | Discharge status: Home / Self Care | Payer: Self-pay | Source: Ambulatory Visit | Attending: Orthopedic Surgery

## 2016-05-13 ENCOUNTER — Ambulatory Visit (HOSPITAL_COMMUNITY): Payer: 59

## 2016-05-13 ENCOUNTER — Encounter (HOSPITAL_COMMUNITY): Payer: Self-pay | Admitting: *Deleted

## 2016-05-13 DIAGNOSIS — K219 Gastro-esophageal reflux disease without esophagitis: Secondary | ICD-10-CM | POA: Insufficient documentation

## 2016-05-13 DIAGNOSIS — E119 Type 2 diabetes mellitus without complications: Secondary | ICD-10-CM | POA: Insufficient documentation

## 2016-05-13 DIAGNOSIS — M549 Dorsalgia, unspecified: Secondary | ICD-10-CM | POA: Diagnosis present

## 2016-05-13 DIAGNOSIS — I10 Essential (primary) hypertension: Secondary | ICD-10-CM | POA: Diagnosis not present

## 2016-05-13 DIAGNOSIS — Z79899 Other long term (current) drug therapy: Secondary | ICD-10-CM | POA: Insufficient documentation

## 2016-05-13 DIAGNOSIS — E7800 Pure hypercholesterolemia, unspecified: Secondary | ICD-10-CM | POA: Insufficient documentation

## 2016-05-13 DIAGNOSIS — Z79890 Hormone replacement therapy (postmenopausal): Secondary | ICD-10-CM | POA: Insufficient documentation

## 2016-05-13 DIAGNOSIS — Z419 Encounter for procedure for purposes other than remedying health state, unspecified: Secondary | ICD-10-CM

## 2016-05-13 DIAGNOSIS — G894 Chronic pain syndrome: Secondary | ICD-10-CM | POA: Diagnosis not present

## 2016-05-13 DIAGNOSIS — M961 Postlaminectomy syndrome, not elsewhere classified: Secondary | ICD-10-CM | POA: Insufficient documentation

## 2016-05-13 DIAGNOSIS — G4730 Sleep apnea, unspecified: Secondary | ICD-10-CM | POA: Insufficient documentation

## 2016-05-13 HISTORY — PX: SPINAL CORD STIMULATOR INSERTION: SHX5378

## 2016-05-13 LAB — GLUCOSE, CAPILLARY
GLUCOSE-CAPILLARY: 108 mg/dL — AB (ref 65–99)
GLUCOSE-CAPILLARY: 126 mg/dL — AB (ref 65–99)
GLUCOSE-CAPILLARY: 169 mg/dL — AB (ref 65–99)
Glucose-Capillary: 135 mg/dL — ABNORMAL HIGH (ref 65–99)
Glucose-Capillary: 117 mg/dL — ABNORMAL HIGH (ref 65–99)
Glucose-Capillary: 117 mg/dL — ABNORMAL HIGH (ref 65–99)

## 2016-05-13 SURGERY — INSERTION, SPINAL CORD STIMULATOR, LUMBAR
Anesthesia: General | Site: Back

## 2016-05-13 MED ORDER — PROMETHAZINE HCL 25 MG PO TABS: 25.0000 mg | ORAL_TABLET | Freq: Three times a day (TID) | ORAL | Status: DC | PRN

## 2016-05-13 MED ORDER — LACTATED RINGERS IV SOLN: INTRAVENOUS | Status: DC

## 2016-05-13 MED ORDER — IRBESARTAN 150 MG PO TABS
150.0000 mg | ORAL_TABLET | Freq: Every day | ORAL | Status: DC
  Administered 2016-05-13: 150 mg via ORAL
  Filled 2016-05-13 (×2): qty 1

## 2016-05-13 MED ORDER — ONDANSETRON HCL 4 MG/2ML IJ SOLN
INTRAMUSCULAR | Status: DC | PRN
  Administered 2016-05-13: 4 mg via INTRAVENOUS

## 2016-05-13 MED ORDER — HYDROCHLOROTHIAZIDE 12.5 MG PO CAPS
12.5000 mg | ORAL_CAPSULE | Freq: Every day | ORAL | Status: DC
  Administered 2016-05-13: 12.5 mg via ORAL
  Filled 2016-05-13: qty 1

## 2016-05-13 MED ORDER — ONDANSETRON HCL 4 MG/2ML IJ SOLN: 4.0000 mg | INTRAMUSCULAR | Status: DC | PRN

## 2016-05-13 MED ORDER — SODIUM CHLORIDE 0.9% FLUSH: 3.0000 mL | INTRAVENOUS | Status: DC | PRN

## 2016-05-13 MED ORDER — THROMBIN 20000 UNITS EX SOLR
CUTANEOUS | Status: DC | PRN
  Administered 2016-05-13: 20000 [IU] via TOPICAL

## 2016-05-13 MED ORDER — DIPHENHYDRAMINE HCL 50 MG/ML IJ SOLN
INTRAMUSCULAR | Status: AC
  Filled 2016-05-13: qty 1

## 2016-05-13 MED ORDER — ONDANSETRON HCL 4 MG/2ML IJ SOLN
INTRAMUSCULAR | Status: AC
  Filled 2016-05-13: qty 2

## 2016-05-13 MED ORDER — VALSARTAN-HYDROCHLOROTHIAZIDE 160-12.5 MG PO TABS: 1.0000 | ORAL_TABLET | Freq: Every day | ORAL | Status: DC

## 2016-05-13 MED ORDER — INSULIN ASPART 100 UNIT/ML ~~LOC~~ SOLN: 0.0000 [IU] | Freq: Three times a day (TID) | SUBCUTANEOUS | Status: DC

## 2016-05-13 MED ORDER — ACETAMINOPHEN 10 MG/ML IV SOLN
INTRAVENOUS | Status: AC
  Filled 2016-05-13: qty 100

## 2016-05-13 MED ORDER — SODIUM CHLORIDE 0.9% FLUSH
3.0000 mL | Freq: Two times a day (BID) | INTRAVENOUS | Status: DC
  Administered 2016-05-13: 3 mL via INTRAVENOUS

## 2016-05-13 MED ORDER — INSULIN ASPART 100 UNIT/ML ~~LOC~~ SOLN: 0.0000 [IU] | SUBCUTANEOUS | Status: DC

## 2016-05-13 MED ORDER — EPHEDRINE 5 MG/ML INJ
INTRAVENOUS | Status: AC
  Filled 2016-05-13: qty 10

## 2016-05-13 MED ORDER — ONDANSETRON HCL 4 MG PO TABS: 4.0000 mg | ORAL_TABLET | Freq: Three times a day (TID) | ORAL | 0 refills | Status: DC | PRN

## 2016-05-13 MED ORDER — GEMFIBROZIL 600 MG PO TABS
300.0000 mg | ORAL_TABLET | Freq: Two times a day (BID) | ORAL | Status: DC
  Administered 2016-05-13: 300 mg via ORAL
  Filled 2016-05-13 (×2): qty 0.5

## 2016-05-13 MED ORDER — PANTOPRAZOLE SODIUM 40 MG PO TBEC
40.0000 mg | DELAYED_RELEASE_TABLET | Freq: Every day | ORAL | Status: DC
  Filled 2016-05-13: qty 1

## 2016-05-13 MED ORDER — BUPIVACAINE-EPINEPHRINE (PF) 0.25% -1:200000 IJ SOLN
INTRAMUSCULAR | Status: AC
  Filled 2016-05-13: qty 30

## 2016-05-13 MED ORDER — MIDAZOLAM HCL 5 MG/5ML IJ SOLN
INTRAMUSCULAR | Status: DC | PRN
  Administered 2016-05-13: 2 mg via INTRAVENOUS

## 2016-05-13 MED ORDER — SCOPOLAMINE 1 MG/3DAYS TD PT72
MEDICATED_PATCH | TRANSDERMAL | Status: DC | PRN
  Administered 2016-05-13: 1 via TRANSDERMAL

## 2016-05-13 MED ORDER — METOPROLOL TARTRATE 25 MG PO TABS
25.0000 mg | ORAL_TABLET | Freq: Every day | ORAL | Status: DC
  Administered 2016-05-13: 25 mg via ORAL
  Filled 2016-05-13: qty 1

## 2016-05-13 MED ORDER — FENTANYL CITRATE (PF) 100 MCG/2ML IJ SOLN
INTRAMUSCULAR | Status: DC | PRN
  Administered 2016-05-13: 200 ug via INTRAVENOUS
  Administered 2016-05-13: 100 ug via INTRAVENOUS

## 2016-05-13 MED ORDER — LIDOCAINE 2% (20 MG/ML) 5 ML SYRINGE
INTRAMUSCULAR | Status: AC
  Filled 2016-05-13: qty 5

## 2016-05-13 MED ORDER — MIDAZOLAM HCL 2 MG/2ML IJ SOLN
INTRAMUSCULAR | Status: AC
  Filled 2016-05-13: qty 2

## 2016-05-13 MED ORDER — OXYCODONE-ACETAMINOPHEN 10-325 MG PO TABS: 1.0000 | ORAL_TABLET | ORAL | 0 refills | Status: DC | PRN

## 2016-05-13 MED ORDER — OXYCODONE HCL 5 MG PO TABS
10.0000 mg | ORAL_TABLET | ORAL | Status: DC | PRN
  Administered 2016-05-13 – 2016-05-14 (×4): 5 mg via ORAL
  Filled 2016-05-13 (×4): qty 2

## 2016-05-13 MED ORDER — PHENOL 1.4 % MT LIQD: 1.0000 | OROMUCOSAL | Status: DC | PRN

## 2016-05-13 MED ORDER — CEFAZOLIN IN D5W 1 GM/50ML IV SOLN
1.0000 g | Freq: Three times a day (TID) | INTRAVENOUS | Status: AC
  Administered 2016-05-13 (×2): 1 g via INTRAVENOUS
  Filled 2016-05-13 (×2): qty 50

## 2016-05-13 MED ORDER — EPHEDRINE SULFATE 50 MG/ML IJ SOLN
INTRAMUSCULAR | Status: DC | PRN
  Administered 2016-05-13 (×2): 5 mg via INTRAVENOUS

## 2016-05-13 MED ORDER — METHOCARBAMOL 500 MG PO TABS: 500.0000 mg | ORAL_TABLET | Freq: Three times a day (TID) | ORAL | 0 refills | Status: DC | PRN

## 2016-05-13 MED ORDER — INSULIN ASPART 100 UNIT/ML ~~LOC~~ SOLN: 20.0000 [IU] | Freq: Three times a day (TID) | SUBCUTANEOUS | Status: DC

## 2016-05-13 MED ORDER — FENTANYL CITRATE (PF) 100 MCG/2ML IJ SOLN
INTRAMUSCULAR | Status: AC
  Filled 2016-05-13: qty 2

## 2016-05-13 MED ORDER — 0.9 % SODIUM CHLORIDE (POUR BTL) OPTIME
TOPICAL | Status: DC | PRN
  Administered 2016-05-13 (×2): 1000 mL

## 2016-05-13 MED ORDER — LACTATED RINGERS IV SOLN
INTRAVENOUS | Status: DC | PRN
  Administered 2016-05-13 (×3): via INTRAVENOUS

## 2016-05-13 MED ORDER — LIDOCAINE HCL (CARDIAC) 20 MG/ML IV SOLN
INTRAVENOUS | Status: DC | PRN
Start: 2016-05-13 — End: 2016-05-13
  Administered 2016-05-13: 100 mg via INTRAVENOUS

## 2016-05-13 MED ORDER — PROPOFOL 10 MG/ML IV BOLUS
INTRAVENOUS | Status: AC
  Filled 2016-05-13: qty 20

## 2016-05-13 MED ORDER — PROMETHAZINE HCL 25 MG RE SUPP: 25.0000 mg | Freq: Four times a day (QID) | RECTAL | Status: DC | PRN

## 2016-05-13 MED ORDER — INSULIN DETEMIR 100 UNIT/ML ~~LOC~~ SOLN
45.0000 [IU] | Freq: Two times a day (BID) | SUBCUTANEOUS | Status: DC
  Administered 2016-05-13: 45 [IU] via SUBCUTANEOUS
  Filled 2016-05-13 (×3): qty 0.45

## 2016-05-13 MED ORDER — SODIUM CHLORIDE 0.9 % IV SOLN: 250.0000 mL | INTRAVENOUS | Status: DC

## 2016-05-13 MED ORDER — PROPOFOL 10 MG/ML IV BOLUS
INTRAVENOUS | Status: DC | PRN
  Administered 2016-05-13: 150 mg via INTRAVENOUS

## 2016-05-13 MED ORDER — BUPIVACAINE HCL (PF) 0.25 % IJ SOLN
INTRAMUSCULAR | Status: AC
  Filled 2016-05-13: qty 30

## 2016-05-13 MED ORDER — FENTANYL CITRATE (PF) 100 MCG/2ML IJ SOLN: 25.0000 ug | INTRAMUSCULAR | Status: DC | PRN

## 2016-05-13 MED ORDER — BUPIVACAINE-EPINEPHRINE 0.25% -1:200000 IJ SOLN
INTRAMUSCULAR | Status: DC | PRN
  Administered 2016-05-13: 10 mL

## 2016-05-13 MED ORDER — DIPHENHYDRAMINE HCL 50 MG/ML IJ SOLN
INTRAMUSCULAR | Status: DC | PRN
  Administered 2016-05-13: 12.5 mg via INTRAVENOUS

## 2016-05-13 MED ORDER — ACETAMINOPHEN 10 MG/ML IV SOLN
INTRAVENOUS | Status: DC | PRN
  Administered 2016-05-13: 1000 mg via INTRAVENOUS

## 2016-05-13 MED ORDER — SUGAMMADEX SODIUM 200 MG/2ML IV SOLN
INTRAVENOUS | Status: DC | PRN
  Administered 2016-05-13: 200 mg via INTRAVENOUS

## 2016-05-13 MED ORDER — SUGAMMADEX SODIUM 200 MG/2ML IV SOLN
INTRAVENOUS | Status: AC
  Filled 2016-05-13: qty 2

## 2016-05-13 MED ORDER — METHOCARBAMOL 500 MG PO TABS
500.0000 mg | ORAL_TABLET | Freq: Four times a day (QID) | ORAL | Status: DC | PRN
  Administered 2016-05-13 – 2016-05-14 (×2): 500 mg via ORAL
  Filled 2016-05-13 (×2): qty 1

## 2016-05-13 MED ORDER — HYDROMORPHONE HCL 1 MG/ML IJ SOLN
INTRAMUSCULAR | Status: DC | PRN
  Administered 2016-05-13: 0.5 mg via INTRAVENOUS

## 2016-05-13 MED ORDER — MENTHOL 3 MG MT LOZG: 1.0000 | LOZENGE | OROMUCOSAL | Status: DC | PRN

## 2016-05-13 MED ORDER — SCOPOLAMINE 1 MG/3DAYS TD PT72
MEDICATED_PATCH | TRANSDERMAL | Status: AC
  Filled 2016-05-13: qty 1

## 2016-05-13 MED ORDER — MORPHINE SULFATE (PF) 2 MG/ML IV SOLN: 1.0000 mg | INTRAVENOUS | Status: DC | PRN

## 2016-05-13 MED ORDER — ROCURONIUM BROMIDE 10 MG/ML (PF) SYRINGE
PREFILLED_SYRINGE | INTRAVENOUS | Status: AC
  Filled 2016-05-13: qty 10

## 2016-05-13 MED ORDER — METHOCARBAMOL 1000 MG/10ML IJ SOLN
500.0000 mg | Freq: Four times a day (QID) | INTRAVENOUS | Status: DC | PRN
  Filled 2016-05-13: qty 5

## 2016-05-13 MED ORDER — METFORMIN HCL ER 500 MG PO TB24
1000.0000 mg | ORAL_TABLET | Freq: Two times a day (BID) | ORAL | Status: DC
  Administered 2016-05-13 – 2016-05-14 (×2): 1000 mg via ORAL
  Filled 2016-05-13 (×2): qty 2

## 2016-05-13 MED ORDER — THROMBIN 20000 UNITS EX SOLR
CUTANEOUS | Status: AC
  Filled 2016-05-13: qty 20000

## 2016-05-13 MED ORDER — HYDROMORPHONE HCL 1 MG/ML IJ SOLN
INTRAMUSCULAR | Status: AC
  Filled 2016-05-13: qty 1

## 2016-05-13 MED ORDER — ROCURONIUM 10MG/ML (10ML) SYRINGE FOR MEDFUSION PUMP - OPTIME
INTRAVENOUS | Status: DC | PRN
  Administered 2016-05-13: 70 mg via INTRAVENOUS
  Administered 2016-05-13: 10 mg via INTRAVENOUS

## 2016-05-13 SURGICAL SUPPLY — 55 items
CANISTER SUCTION 2500CC (MISCELLANEOUS) ×2 IMPLANT
CLSR STERI-STRIP ANTIMIC 1/2X4 (GAUZE/BANDAGES/DRESSINGS) ×2 IMPLANT
COVER PROBE W GEL 5X96 (DRAPES) IMPLANT
COVER SURGICAL LIGHT HANDLE (MISCELLANEOUS) ×2 IMPLANT
DRAPE C-ARM 42X72 X-RAY (DRAPES) ×2 IMPLANT
DRAPE C-ARMOR (DRAPES) ×2 IMPLANT
DRAPE INCISE IOBAN 85X60 (DRAPES) ×2 IMPLANT
DRAPE POUCH INSTRU U-SHP 10X18 (DRAPES) ×1 IMPLANT
DRAPE SURG 17X23 STRL (DRAPES) ×2 IMPLANT
DRAPE U-SHAPE 47X51 STRL (DRAPES) ×2 IMPLANT
DRSG AQUACEL AG ADV 3.5X 4 (GAUZE/BANDAGES/DRESSINGS) ×2 IMPLANT
DRSG AQUACEL AG ADV 3.5X 6 (GAUZE/BANDAGES/DRESSINGS) ×2 IMPLANT
DRSG AQUACEL AG ADV 3.5X10 (GAUZE/BANDAGES/DRESSINGS) ×2 IMPLANT
DURAPREP 26ML APPLICATOR (WOUND CARE) ×2 IMPLANT
ELECT BLADE 4.0 EZ CLEAN MEGAD (MISCELLANEOUS) ×2
ELECT CAUTERY BLADE 6.4 (BLADE) ×2 IMPLANT
ELECT PENCIL ROCKER SW 15FT (MISCELLANEOUS) ×2 IMPLANT
ELECT REM PT RETURN 9FT ADLT (ELECTROSURGICAL) ×2
ELECTRODE BLDE 4.0 EZ CLN MEGD (MISCELLANEOUS) ×1 IMPLANT
ELECTRODE REM PT RTRN 9FT ADLT (ELECTROSURGICAL) ×1 IMPLANT
GENERATOR PULSE PROCLAIM 5ELIT (Neuro Prosthesis/Implant) IMPLANT
GLOVE BIOGEL PI IND STRL 8.5 (GLOVE) ×1 IMPLANT
GLOVE BIOGEL PI INDICATOR 8.5 (GLOVE) ×1
GLOVE SS N UNI LF 8.5 STRL (GLOVE) ×4 IMPLANT
GOWN STRL REUS W/TWL 2XL LVL3 (GOWN DISPOSABLE) ×2 IMPLANT
KIT BASIN OR (CUSTOM PROCEDURE TRAY) ×2 IMPLANT
KIT ROOM TURNOVER OR (KITS) ×2 IMPLANT
LAMI NARROW PRIPOLE 16CH (Orthopedic Implant) ×1 IMPLANT
NDL SPNL 18GX3.5 QUINCKE PK (NEEDLE) ×3 IMPLANT
NEEDLE 22X1 1/2 (OR ONLY) (NEEDLE) ×2 IMPLANT
NEEDLE SPNL 18GX3.5 QUINCKE PK (NEEDLE) ×6 IMPLANT
NS IRRIG 1000ML POUR BTL (IV SOLUTION) ×3 IMPLANT
PACK LAMINECTOMY ORTHO (CUSTOM PROCEDURE TRAY) ×2 IMPLANT
PACK UNIVERSAL I (CUSTOM PROCEDURE TRAY) ×2 IMPLANT
PAD ARMBOARD 7.5X6 YLW CONV (MISCELLANEOUS) ×5 IMPLANT
PULSE GENERATOR PROCLAIM 5ELIT (Neuro Prosthesis/Implant) ×2 IMPLANT
SPATULA SILICONE BRAIN 10MM (MISCELLANEOUS) ×1 IMPLANT
SPONGE LAP 4X18 X RAY DECT (DISPOSABLE) IMPLANT
SPONGE SURGIFOAM ABS GEL 100 (HEMOSTASIS) ×2 IMPLANT
STAPLER VISISTAT 35W (STAPLE) ×2 IMPLANT
SURGIFLO W/THROMBIN 8M KIT (HEMOSTASIS) ×2 IMPLANT
SUT BONE WAX W31G (SUTURE) ×2 IMPLANT
SUT FIBERWIRE #2 38 T-5 BLUE (SUTURE) ×2
SUT VIC AB 1 CT1 18XCR BRD 8 (SUTURE) ×1 IMPLANT
SUT VIC AB 1 CT1 27 (SUTURE) ×2
SUT VIC AB 1 CT1 27XBRD ANBCTR (SUTURE) ×1 IMPLANT
SUT VIC AB 1 CT1 8-18 (SUTURE) ×2
SUT VIC AB 2-0 CT1 18 (SUTURE) ×2 IMPLANT
SUTURE FIBERWR #2 38 T-5 BLUE (SUTURE) ×1 IMPLANT
SYR BULB IRRIGATION 50ML (SYRINGE) ×2 IMPLANT
SYR CONTROL 10ML LL (SYRINGE) ×2 IMPLANT
TOWEL OR 17X24 6PK STRL BLUE (TOWEL DISPOSABLE) ×2 IMPLANT
TOWEL OR 17X26 10 PK STRL BLUE (TOWEL DISPOSABLE) ×2 IMPLANT
TRAY FOLEY CATH 16FRSI W/METER (SET/KITS/TRAYS/PACK) IMPLANT
WATER STERILE IRR 1000ML POUR (IV SOLUTION) ×2 IMPLANT

## 2016-05-13 NOTE — H&P (Signed)
History of Present Illness  The patient is a 55 year old female who presents with back pain. The patient is here today for a surgical consult (for SCS from Dr Nelva Bush). The patient reports lumbosacral area symptoms including pain which began year(s) ago. and Symptoms include pain and numbness (and tingling in the right leg to the foot). The patient describes the severity of their symptoms as 3 / 10 on an analog pain scale. The patient feels as if the symptoms are unchanging. Symptoms are exacerbated by standing, sitting, lifting and bending. The patient is not currently being treated for this problem. Pertinent medical history includes previous back surgery (3 surgeries - 1997, 1999, 2000 - had L5-S1 Fusion by Dr Mercie Eon in [place], [place]) and chronic back pain. Past evaluation has included MRI of the lumbar spine (thoracic @ [place] 04-07-16). Past treatment has included non-opioid analgesics, opioid analgesics, activity modification, heating pad and back surgery. Note for "Back pain": The patient had a successful trial SCS and is in today to discuss permanent placement.  Additional reasons for visit:  Transition into care is described as the following: The patient is transitioning into care and a summary of care was reviewed.   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)  Chronic pain syndrome (G89.4)  Right elbow tendinitis (M77.8)  Elbow pain, right (M25.521)  Right lateral epicondylitis (M77.11)   Allergies Actos *ANTIDIABETICS*  Allergies Reconciled   Family History Hypertension  father and grandfather mothers side Heart Disease   father Kidney disease  grandmother mothers side Diabetes Mellitus  mother, father, sister, grandmother mothers side and grandmother fathers side  Social History  Current work status  disabled Number of flights of stairs before winded  4-5 Drug/Alcohol Rehab (Previously)  no Children  1 Alcohol use  never consumed alcohol Drug/Alcohol Rehab (Currently)  no Exercise  does running / walking Illicit drug use  no Tobacco use  never smoker Pain Contract  no Marital status  married Living situation  live with spouse  Medication History  Levemir FlexPen (100UNIT/ML Solution, Subcutaneous) Active. (bid) NovoLOG FlexPen (100UNIT/ML Solution, Subcutaneous) Active. (sliding scale w/ea meal) Diovan HCT (160-12.5MG  Tablet, Oral) Active. (qd) Aciphex (20MG  Tablet DR, Oral) Active. (qd) MetFORMIN HCl (500MG  Tablet, Oral) Active. (bid) Gemfibrozil (600MG  Tablet, Oral) Active. (bid) Estradiol (1MG  Tablet, Oral) Active. Invokana (300MG  Tablet, Oral) Active. Metoprolol Tartrate (37.5MG  Tablet, Oral) Active. Multiple Vitamin (1 (one) Oral) Active. Vitamin D (2000UNIT Tablet, Oral) Active. Medications Reconciled  Past Surgical History Hysterectomy  complete (non-cancerous) Spinal Fusion  lower back Spinal Surgery  Rotator Cuff Repair  bilateral Arthroscopy of Shoulder  bilateral Spinal Decompression  lower back Gallbladder Surgery  open  Other Problems  Cardiac Arrhythmia  Diabetes Mellitus, Type II  High blood pressure  Hypercholesterolemia  Oophorectomy  bilateral  Objective Transcription Omie is a pleasant woman who appears much younger than her stated age. She is alert and oriented x3. She has had some increasing knee pain on the left side for which she is using brace. Clinically, she has EHL, tibialis anterior weakness of 4/5 on the right side, which is chronic. She also has dysesthesias in the bilateral lower extremities, which is chronic. She  has no  significant hip, knee or ankle pain, except for that left side. She has back pain, especially back, buttock onto the right side. No shortness of breath, no chest pain. Abdomen is soft, nontender. No loss of bowel and bladder control. Two previous surgical scars from her previous lumbar surgeries. No signs of infection.  DIAGNOSTIC DATA MRI of the thoracic spine shows no significant canal limiting stenosis, no cord signal changes, just she has degenerative changes seen indirectly in the cervical spine, but again no cord signal changes.  Plan At this point, the patient has 85% improvement in chronic back, buttock and neuropathic leg pain, right side greater than left during the stimulation trial. We discussed the permanent implant. Risks include infection, bleeding, nerve damage, death, stroke, paralysis, migration of the lead, blood clots, ongoing or worse pain, need for revision of the lead or the battery or both. All of her questions were addressed. We will plan ot surgery after she returns from her vacation in [place] in early September.

## 2016-05-13 NOTE — Anesthesia Postprocedure Evaluation (Signed)
Anesthesia Post Note  Patient: Alexis Black  Procedure(s) Performed: Procedure(s) (LRB): LUMBAR SPINAL CORD STIMULATOR INSERTION (N/A)  Patient location during evaluation: PACU Anesthesia Type: General Level of consciousness: sedated Pain management: satisfactory to patient Vital Signs Assessment: post-procedure vital signs reviewed and stable Respiratory status: spontaneous breathing Cardiovascular status: stable Anesthetic complications: no    Last Vitals:  Vitals:   05/13/16 1124 05/13/16 1145  BP: 137/87 (!) 149/78  Pulse: 76 75  Resp: 14 18  Temp: 36.9 C 36.9 C    Last Pain:  Vitals:   05/13/16 1150  TempSrc:   PainSc: Stuart

## 2016-05-13 NOTE — Evaluation (Signed)
Occupational Therapy Evaluation/Discharge Patient Details Name: Alexis Black MRN: [id number] DOB: [birth year] Today's Date: 05/13/2016    History of Present Illness 55 y.o. female s/p spinal cord stimulator placement. PMH significant for HTN, DM insulin dependent, GERD, chronic pain syndrome, L knee, neck, low back and R elbow pain.   Clinical Impression   PTA, pt was independent with ADLs and mobility. Pt currently requires supervision for all ADLs and basic transfers due to acute incisional back pain. Educated pt on good back health tips, compensatory strategies for LB ADLs, use of AE, proper body mechanics and posture, and fall prevention strategies. All education has been completed and pt has no further questions. Pt with no further acute OT needs. OT signing off. Thank you for this referral.    Follow Up Recommendations  No OT follow up;Supervision/Assistance - 24 hour    Equipment Recommendations  None recommended by OT    Recommendations for Other Services       Precautions / Restrictions Precautions Precautions: Fall Restrictions Weight Bearing Restrictions: No      Mobility Bed Mobility Overal bed mobility: Needs Assistance Bed Mobility: Sidelying to Sit;Sit to Sidelying   Sidelying to sit: Supervision     Sit to sidelying: Supervision General bed mobility comments: Supervision for safety. Educated pt on log roll technique for pain relief and comfort.   Transfers Overall transfer level: Needs assistance Equipment used:  (IV pole) Transfers: Sit to/from Stand Sit to Stand: Supervision         General transfer comment: Supervision for safety. Pt held onto IV pole with both hands - suggested trialing RW next session.    Balance Overall balance assessment: Needs assistance Sitting-balance support: No upper extremity supported;Feet supported Sitting balance-Leahy Scale: Fair Sitting balance - Comments: limited due to pain   Standing balance support: No  upper extremity supported;During functional activity Standing balance-Leahy Scale: Fair Standing balance comment: Pt used IV pole for support when ambulating, but was able to do so safely without UE support                            ADL Overall ADL's : Needs assistance/impaired     Grooming: Wash/dry hands;Supervision/safety;Standing   Upper Body Bathing: Supervision/ safety;Sitting   Lower Body Bathing: Supervison/ safety;Sit to/from stand Lower Body Bathing Details (indicate cue type and reason): educated on use of long-handled sponge         Toilet Transfer: Supervision/safety;Ambulation;Regular Toilet   Toileting- Water quality scientist and Hygiene: Supervision/safety;Sit to/from stand   Tub/ Shower Transfer: Walk-in shower;Supervision/safety;Ambulation;Rolling walker   Functional mobility during ADLs: Supervision/safety;Rolling walker General ADL Comments: Educated pt on good back health tips including positioning in bed, compensatory strategies for LB ADLs, avoid lifting, proper body mechanics and posture.     Vision Vision Assessment?: No apparent visual deficits   Perception     Praxis      Pertinent Vitals/Pain Pain Assessment: 0-10 Pain Score: 9  Pain Location: insicion site Pain Descriptors / Indicators: Sore;Tender Pain Intervention(s): Limited activity within patient's tolerance;Monitored during session;Premedicated before session;Repositioned;Ice applied     Hand Dominance Right   Extremity/Trunk Assessment Upper Extremity Assessment Upper Extremity Assessment: Overall WFL for tasks assessed   Lower Extremity Assessment Lower Extremity Assessment: Overall WFL for tasks assessed   Cervical / Trunk Assessment Cervical / Trunk Assessment: Other exceptions Cervical / Trunk Exceptions: s/p back surgery   Communication Communication Communication: No difficulties   Cognition Arousal/Alertness: Awake/alert  Behavior During Therapy: WFL for  tasks assessed/performed Overall Cognitive Status: Within Functional Limits for tasks assessed                     General Comments       Exercises       Shoulder Instructions      Home Living Family/patient expects to be discharged to:: Private residence Living Arrangements: Spouse/significant other Available Help at Discharge: Family;Available 24 hours/day Type of Home: House Home Access: Stairs to enter [employer] of Steps: 3 Entrance Stairs-Rails: Right Home Layout: One level     Bathroom Shower/Tub: Occupational psychologist: Handicapped height     Home Equipment: Environmental consultant - 2 wheels;Cane - single point;Bedside commode;Shower seat;Hand held shower head;Wheelchair - manual          Prior Functioning/Environment Level of Independence: Independent             OT Diagnosis: Acute pain   OT Problem List: Impaired balance (sitting and/or standing);Decreased safety awareness;Decreased knowledge of use of DME or AE;Pain;Decreased activity tolerance   OT Treatment/Interventions:      OT Goals(Current goals can be found in the care plan section) Acute Rehab OT Goals Patient Stated Goal: to go home OT Goal Formulation: With patient Time For Goal Achievement: 05/27/16 Potential to Achieve Goals: Good  OT Frequency:     Barriers to D/C:            Co-evaluation              End of Session Equipment Utilized During Treatment: Gait belt Nurse Communication: Mobility status  Activity Tolerance: Patient limited by pain Patient left: in bed;with call bell/phone within reach;with family/visitor present   Time: [id number] OT Time Calculation (min): 18 min Charges:  OT General Charges $OT Visit: 1 Procedure OT Evaluation $OT Eval Low Complexity: 1 Procedure G-Codes: OT G-codes **NOT FOR INPATIENT CLASS** Functional Assessment Tool Used: clinical judgement Functional Limitation: Self care Self Care Current Status [id number]): At  least 1 percent but less than 20 percent impaired, limited or restricted Self Care Goal Status [id number]): At least 1 percent but less than 20 percent impaired, limited or restricted Self Care Discharge Status [phone]): At least 1 percent but less than 20 percent impaired, limited or restricted  Redmond Baseman, OTR/L Pager: [phone] 05/13/2016, 4:19 PM

## 2016-05-13 NOTE — Anesthesia Procedure Notes (Signed)
Procedure Name: Intubation Date/Time: 05/13/2016 7:39 AM Performed by: Huey Romans ANN Pre-anesthesia Checklist: Patient identified, Emergency Drugs available, Suction available and Patient being monitored Patient Re-evaluated:Patient Re-evaluated prior to inductionOxygen Delivery Method: Circle System Utilized Preoxygenation: Pre-oxygenation with 100% oxygen Intubation Type: IV induction Ventilation: Mask ventilation without difficulty Laryngoscope Size: Miller and 2 Grade View: Grade I Tube type: Oral Tube size: 7.0 mm Number of attempts: 1 Airway Equipment and Method: Stylet and Oral airway Placement Confirmation: ETT inserted through vocal cords under direct vision,  positive ETCO2 and breath sounds checked- equal and bilateral Secured at: 21 cm Tube secured with: Tape Dental Injury: Teeth and Oropharynx as per pre-operative assessment

## 2016-05-13 NOTE — Transfer of Care (Signed)
Immediate Anesthesia Transfer of Care Note  Patient: Alexis Black  Procedure(s) Performed: Procedure(s): LUMBAR SPINAL CORD STIMULATOR INSERTION (N/A)  Patient Location: PACU  Anesthesia Type:General  Level of Consciousness: awake, alert  and oriented  Airway & Oxygen Therapy: Patient Spontanous Breathing and Patient connected to face mask oxygen  Post-op Assessment: Report given to RN and Post -op Vital signs reviewed and stable  Post vital signs: Reviewed and stable  Last Vitals:  Vitals:   05/13/16 0619 05/13/16 0958  BP: (!) 154/80 140/81  Pulse: 69 94  Resp: 20 18  Temp: 36.7 C 36.5 C    Last Pain:  Vitals:   05/13/16 0958  TempSrc:   PainSc: (P) 0-No pain      Patients Stated Pain Goal: 3 ([phone])  Complications: No apparent anesthesia complications

## 2016-05-13 NOTE — Brief Op Note (Signed)
05/13/2016  9:31 AM  PATIENT:  Alexis Black  55 y.o. female  PRE-OPERATIVE DIAGNOSIS:  chronic pain and failed back syndrome  POST-OPERATIVE DIAGNOSIS:  chronic pain and failed back syndrome  PROCEDURE:  Procedure(s): LUMBAR SPINAL CORD STIMULATOR INSERTION (N/A)  SURGEON:  Surgeon(s) and Role:    * Melina Schools, MD - Primary  PHYSICIAN ASSISTANT:   ASSISTANTS: Carmen Mayo   ANESTHESIA:   general  EBL:  Total I/O In: 1300 [I.V.:1300] Out: 50 [Blood:50]  BLOOD ADMINISTERED:none  DRAINS: none   LOCAL MEDICATIONS USED:  MARCAINE     SPECIMEN:  No Specimen  DISPOSITION OF SPECIMEN:  N/A  COUNTS:  YES  TOURNIQUET:  * No tourniquets in log *  DICTATION: .Other Dictation: Dictation Number [id number]  PLAN OF CARE: Admit for overnight observation  PATIENT DISPOSITION:  PACU - hemodynamically stable.

## 2016-05-14 DIAGNOSIS — G894 Chronic pain syndrome: Secondary | ICD-10-CM | POA: Diagnosis not present

## 2016-05-14 LAB — GLUCOSE, CAPILLARY: GLUCOSE-CAPILLARY: 184 mg/dL — AB (ref 65–99)

## 2016-05-14 NOTE — Progress Notes (Signed)
    Subjective: Procedure(s) (LRB): LUMBAR SPINAL CORD STIMULATOR INSERTION (N/A) 1 Day Post-Op  Patient reports pain as 3 on 0-10 scale.  Reports decreased leg pain reports incisional back pain   Positive void Negative bowel movement Positive flatus Negative chest pain or shortness of breath  Objective: Vital signs in last 24 hours: Temp:  [97.7 F (36.5 C)-102 F (38.9 C)] 99.8 F (37.7 C) (09/08 0700) Pulse Rate:  [75-101] 101 (09/08 0700) Resp:  [14-20] 20 (09/08 0700) BP: (108-149)/(57-91) 108/57 (09/08 0700) SpO2:  [93 %-99 %] 96 % (09/08 0700)  Intake/Output from previous day: 09/07 0701 - 09/08 0700 In: 1300 [I.V.:1300] Out: 50 [Blood:50]  Labs: No results for input(s): WBC, RBC, HCT, PLT in the last 72 hours. No results for input(s): NA, K, CL, CO2, BUN, CREATININE, GLUCOSE, CALCIUM in the last 72 hours. No results for input(s): LABPT, INR in the last 72 hours.  Physical Exam: Neurologically intact ABD soft Intact pulses distally Incision: dressing C/D/I and no drainage  Assessment/Plan: Patient stable  xrays n/a Continue mobilization with physical therapy Continue care  Advance diet Up with therapy  Plan on d/c today after SCS activated F/u in 2 weeks  Melina Schools, MD [place] [phone]

## 2016-05-14 NOTE — Evaluation (Signed)
Physical Therapy Evaluation Patient Details Name: Alexis Black MRN: [id number] DOB: 15-May-1961 Today's Date: 05/14/2016   History of Present Illness  55 y.o. female s/p spinal cord stimulator placement. PMH significant for HTN, DM insulin dependent, GERD, chronic pain syndrome, L knee, neck, low back and R elbow pain.  Clinical Impression  Patient seen for education and mobility s/p spinal surgery. Patient mobilizing well, education complete, no further acute needs. Will sign off.    Follow Up Recommendations No PT follow up    Equipment Recommendations  None recommended by PT    Recommendations for Other Services       Precautions / Restrictions Precautions Precautions: Fall Restrictions Weight Bearing Restrictions: No      Mobility  Bed Mobility Overal bed mobility: Needs Assistance Bed Mobility: Sidelying to Sit   Sidelying to sit: Supervision       General bed mobility comments: VCs for technique, no physical assist provided, increased time to perform  Transfers Overall transfer level: Needs assistance Equipment used: None Transfers: Sit to/from Stand Sit to Stand: Supervision         General transfer comment: Supervision for safety. Pt held onto IV pole with both hands - suggested trialing RW next session.  Ambulation/Gait Ambulation/Gait assistance: Supervision Ambulation Distance (Feet): 200 Feet Assistive device: None Gait Pattern/deviations: Step-through pattern;Decreased stride length;Narrow base of support Gait velocity: decreased Gait velocity interpretation: Below normal speed for age/gender General Gait Details: 2 standing rest breaks, no physical assist, VCs for increased candence  Stairs Stairs: Yes Stairs assistance: Supervision Stair Management: One rail Right Number of Stairs: 4 General stair comments: VCs for sequencing  Wheelchair Mobility    Modified Rankin (Stroke Patients Only)       Balance     Sitting balance-Leahy  Scale: Fair       Standing balance-Leahy Scale: Fair                               Pertinent Vitals/Pain Pain Assessment: 0-10 Pain Score: 8  Pain Location: back Pain Descriptors / Indicators: Sore Pain Intervention(s): Monitored during session    Home Living Family/patient expects to be discharged to:: Private residence Living Arrangements: Spouse/significant other Available Help at Discharge: Family;Available 24 hours/day Type of Home: House Home Access: Stairs to enter Entrance Stairs-Rails: Right Entrance Stairs-Number of Steps: 3 Home Layout: One level Home Equipment: Walker - 2 wheels;Cane - single point;Bedside commode;Shower seat;Hand held shower head;Wheelchair - manual      Prior Function Level of Independence: Independent               Hand Dominance   Dominant Hand: Right    Extremity/Trunk Assessment   Upper Extremity Assessment: Overall WFL for tasks assessed           Lower Extremity Assessment: Overall WFL for tasks assessed      Cervical / Trunk Assessment: Other exceptions  Communication   Communication: No difficulties  Cognition Arousal/Alertness: Awake/alert Behavior During Therapy: WFL for tasks assessed/performed Overall Cognitive Status: Within Functional Limits for tasks assessed                      General Comments      Exercises        Assessment/Plan    PT Assessment Patent does not need any further PT services  PT Diagnosis Difficulty walking;Acute pain   PT Problem List    PT Treatment  Interventions     PT Goals (Current goals can be found in the Care Plan section) Acute Rehab PT Goals Patient Stated Goal: to go home PT Goal Formulation: All assessment and education complete, DC therapy    Frequency     Barriers to discharge        Co-evaluation               End of Session   Activity Tolerance: Patient tolerated treatment well Patient left: with call bell/phone within  reach (sitting EOB) Nurse Communication: Mobility status    Functional Assessment Tool Used: clinical judgement Functional Limitation: Mobility: Walking and moving around Mobility: Walking and Moving Around Current Status [id number]): At least 1 percent but less than 20 percent impaired, limited or restricted Mobility: Walking and Moving Around Goal Status [phone]): At least 1 percent but less than 20 percent impaired, limited or restricted Mobility: Walking and Moving Around Discharge Status [phone]): At least 1 percent but less than 20 percent impaired, limited or restricted    Time: 0840-0901 PT Time Calculation (min) (ACUTE ONLY): 21 min   Charges:   PT Evaluation $PT Eval Low Complexity: 1 Procedure     PT G Codes:   PT G-Codes **NOT FOR INPATIENT CLASS** Functional Assessment Tool Used: clinical judgement Functional Limitation: Mobility: Walking and moving around Mobility: Walking and Moving Around Current Status [id number]): At least 1 percent but less than 20 percent impaired, limited or restricted Mobility: Walking and Moving Around Goal Status [phone]): At least 1 percent but less than 20 percent impaired, limited or restricted Mobility: Walking and Moving Around Discharge Status [phone]): At least 1 percent but less than 20 percent impaired, limited or restricted    Duncan Dull 05/14/2016, 9:24 AM Alben Deeds, PT DPT  [phone]

## 2016-05-14 NOTE — Op Note (Signed)
Alexis Black, Alexis Black                 ACCOUNT NO.:  [id number]  MEDICAL RECORD NO.:  [id number]  LOCATION:  3C05C                        FACILITY:  [facility]  PHYSICIAN:  Terra Aveni D. Rolena Infante, M.D. DATE OF BIRTH:  September 16, 1960  DATE OF PROCEDURE:  05/13/2016 DATE OF DISCHARGE:                              OPERATIVE REPORT   POSTOPERATIVE DIAGNOSES: 1. Chronic pain syndrome. 2. Failed back syndrome. 3. Successful spinal cord stimulator trial.  POSTOPERATIVE DIAGNOSES: 1. Chronic pain syndrome. 2. Failed back syndrome. 3. Successful spinal cord stimulator trial.  OPERATIVE PROCEDURES:  Implantation of spinal cord stimulator.  IMPLANT USED:  [employer]. [employer]'s Tripole lead via T9 laminotomy with right- sided battery placement.  FIRST ASSISTANT:  Ronette Deter, [place].  HISTORY:  This is a very pleasant, 55 year old woman who has had chronic debilitating back, buttock, and neuropathic right leg pain.  Attempts at conservative management as well as surgical management of her back had failed to alleviate her symptoms.  She ultimately saw my partner, Dr. Nelva Bush and had a spinal cord stimulator trial.  The patient had success with her pain and return in function, and so we elected to proceed with permanent implantation.  All appropriate risks, benefits, and alternatives were discussed with the patient and consent was obtained.  OPERATIVE NOTE:  The patient was brought to the operating room and placed supine on the operating table.  After successful induction of general anesthesia and endotracheal intubation, TEDs and SCDs were applied.  She was turned prone on to the [place] frame.  All bony prominences were well padded.  The thoracolumbar spine was prepped and draped in a standard fashion.  Time-out was taken confirming the patient, procedure, and all other pertinent important data.  Once this was completed, the AP fluoro was used to identify the T12 vertebral body.  This was confirmed with our rep, who had  the trial implant x- rays.  I then identified the T9 pedicle and confirmed position with the rep, who was examining the images from the trial.  Once I confirmed the level, I then marked out the incision site and infiltrated with 0.25% Marcaine.  A scalpel blade used to make incision.  Using Bovie, I dissected down to the deep fascia.  I then stripped the paraspinal muscles to expose the spinous process of T9 and T10 and a portion of the T11.  I then reconfirmed the T9 pedicle and the T9-10 disk space.  This was achieved with fluoroscopy.  Once this was done, I used double-action Leksell rongeur to remove the bulk of the spinous process of T9.  I then used a 3-0 forward angled Karlin curette to develop a plane underneath the T9 lamina and used a 2-mm Kerrison punch to perform a laminotomy of T9.  I then used my Penfield 4 to dissect through the central raphe of the ligamentum flavum creating a plane between the thecal sac and the ligamentum flavum.  Using a 2-mm Kerrison punch, I resected the ligamentum flavum to complete my laminotomy of T9.  With the dorsal surface of the thecal sac exposed, I gently used my Surveyor, quantity to start my pass and then used a trial implant to confirm  that I could place the implant without any undue resistance.  I then obtained the actual implant and gently placed it slightly more to the right-hand side due to this being her more symptomatic side and advanced it so that the tip of the lead was just beyond the T7-8 disk space.  I confirmed with the rep that this was spanning the area of active programming and she confirmed it.  I was pleased with its location and ease with which it was able to be placed.  At this point, I then made 2 holes in the T10 spinous process and then passed FiberWire through it.  I then directly sutured the leads to the spinous process of T10 with #2 FiberWire.  I then looped it around the inferior aspect of the T10 spinous process  and irrigated this wound copiously with normal saline.  I then made a counter incision in the right gluteal region and sharply dissected down.  As I was palpating inferiorly, I noticed that the posterior superior iliac spine was very prominent, so I elected to place the battery slightly more into the posterolateral aspect of the gluteal region.  I created my pocket and then using the submuscular wire passer transferred the passer wires from the thoracic wound to the gluteal wound.  They were then connected to the battery and torqued appropriately.  We then tested the leads and they were functioning without a problem.  I then placed the battery into the pocket and sutured it to the deep fascia with two #1 Vicryl sutures.  At this point, both wounds were copiously irrigated with normal saline and final x-rays were taken, which were satisfactory.  The thoracic deep wound was closed with a #1 Stratafix barbed suture and the battery deep wound was closed with #1 interrupted pop-off Vicryl. The superficial aspect of both wounds were closed with 2-0 Vicryl sutures and a 3-0 Monocryl for the skin.  Steri-Strips, dry dressings were applied, and the patient was ultimately extubated, transferred to PACU without incident.  At the end of the case, all needle and sponge counts were correct.  There were no adverse intraoperative events.  The patient will be admitted to the hospital with a possible discharge today or in the morning based on her level of function.     Lavonne Cass D. Rolena Infante, M.D.     DDB/MEDQ  D:  05/13/2016  T:  05/14/2016  Job:  [id number]  cc:   _____________

## 2016-05-14 NOTE — Progress Notes (Signed)
Discharge instructions reviewed with patient. RXs given to patient. VSS. Transport home by family.   Ave Filter, RN

## 2016-05-14 NOTE — Progress Notes (Signed)
PT and OT have recommended no PT or OT follow up. Patient mobilizing well. No CSW needs. CSW signing off. Please contact if new need(s) arise.          Alexis Dyer, LCSW [facility] ED/21M Clinical Social Worker [phone]

## 2016-05-18 ENCOUNTER — Encounter (HOSPITAL_COMMUNITY): Payer: Self-pay | Admitting: Orthopedic Surgery

## 2016-05-18 NOTE — Discharge Summary (Signed)
Physician Discharge Summary  Patient ID: Alexis Black MRN: [id number] DOB/AGE: 01/28/1961 55 y.o.  Admit date: 05/13/2016 Discharge date: 05/18/2016  Admission Diagnoses:  Chronic Pain Syndrome/ Failed Back syndrome  Discharge Diagnoses:  Active Problems:   Back pain   Past Medical History:  Diagnosis Date  . Deafness in left ear   . Diabetes mellitus without complication ([place])    Type II  . Family history of adverse reaction to anesthesia    mother - PONV  . GERD (gastroesophageal reflux disease)   . Heart palpitations    "with caffine"  . History of bronchitis   . Irregular heart beat   . Numbness and tingling of leg   . PONV (postoperative nausea and vomiting)   . Shortness of breath dyspnea    "with caffiene"   . Sleep apnea    "diagnosed over 10 years ago but I don't wear a CPAP 05/06/16    Surgeries: Procedure(s): LUMBAR SPINAL CORD STIMULATOR INSERTION on 05/13/2016   Consultants (if any):   Discharged Condition: Improved  Hospital Course: ROXANNE DECKELMAN is an 55 y.o. female who was admitted 05/13/2016 with a diagnosis of Chronic Pain syndrome/Failed back syndrome and went to the operating room on 05/13/2016 and underwent the above named procedures.  On day 1 post op pt reports pain controlled on oral medication.  Reports decreased leg pain.  She reports incisional pain.  Pt voiding w/o difficulty.  Positive flatus.  Pt cleared by PT for DC home. Pt med with SCS representative before DC.    She was given perioperative antibiotics:  Anti-infectives    Start     Dose/Rate Route Frequency Ordered Stop   05/13/16 1600  ceFAZolin (ANCEF) IVPB 1 g/50 mL premix     1 g 100 mL/hr over 30 Minutes Intravenous Every 8 hours 05/13/16 1150 05/13/16 2331   05/13/16 0700  ceFAZolin (ANCEF) IVPB 2g/100 mL premix     2 g 200 mL/hr over 30 Minutes Intravenous To [facility] 05/12/16 1353 05/13/16 0740    .  She was given sequential compression devices, early ambulation, and  TED for DVT prophylaxis.  She benefited maximally from the hospital stay and there were no complications.    Recent vital signs:  Vitals:   05/14/16 0700 05/14/16 0926  BP: (!) 108/57 (!) 118/56  Pulse: (!) 101 79  Resp: 20 18  Temp: 99.8 F (37.7 C) 99.3 F (37.4 C)    Recent laboratory studies:  Lab Results  Component Value Date   HGB 11.0 (L) 05/06/2016   HGB 13.5 11/19/2012   HGB 12.1 07/01/2011   Lab Results  Component Value Date   WBC 10.4 05/06/2016   PLT 323 05/06/2016   Lab Results  Component Value Date   INR 1.00 07/01/2011   Lab Results  Component Value Date   NA 139 05/06/2016   K 4.2 05/06/2016   CL 102 05/06/2016   CO2 26 05/06/2016   BUN 23 (H) 05/06/2016   CREATININE 1.12 (H) 05/06/2016   GLUCOSE 136 (H) 05/06/2016    Discharge Medications:     Medication List    STOP taking these medications   promethazine 25 MG suppository Commonly known as:  PHENERGAN   promethazine 25 MG tablet Commonly known as:  PHENERGAN   TRULICITY [id number] Sopn Generic drug:  Dulaglutide     TAKE these medications   gemfibrozil 600 MG tablet Commonly known as:  LOPID Take 300 mg by mouth 2 (  two) times daily before a meal.   LEVEMIR FLEXPEN 100 UNIT/ML injection Generic drug:  insulin detemir Inject 45 Units into the skin 2 (two) times daily.   metFORMIN 500 MG (MOD) 24 hr tablet Commonly known as:  GLUMETZA Take 1,000 mg by mouth 2 (two) times daily with a meal.   methocarbamol 500 MG tablet Commonly known as:  ROBAXIN Take 1 tablet (500 mg total) by mouth 3 (three) times daily as needed for muscle spasms.   metoprolol tartrate 25 MG tablet Commonly known as:  LOPRESSOR Take 25 mg by mouth daily.   NOVOLOG FLEXPEN 100 UNIT/ML injection Generic drug:  insulin aspart Inject 20-31 Units into the skin 3 (three) times daily before meals. Sliding scale as directed   ondansetron 4 MG tablet Commonly known as:  ZOFRAN Take 1 tablet (4 mg total)  by mouth every 8 (eight) hours as needed for nausea or vomiting.   oxyCODONE-acetaminophen 10-325 MG tablet Commonly known as:  PERCOCET Take 1 tablet by mouth every 4 (four) hours as needed for pain.   RABEprazole 20 MG tablet Commonly known as:  ACIPHEX Take 20 mg by mouth daily.   valsartan-hydrochlorothiazide 160-12.5 MG tablet Commonly known as:  DIOVAN-HCT Take 1 tablet by mouth daily.       Diagnostic Studies: Dg Thoracic Spine 2 View  Result Date: 05/13/2016 CLINICAL DATA:  Lumbar spinal cord stimulator insertion EXAM: THORACIC SPINE 2 VIEWS COMPARISON:  None. FINDINGS: Three intraoperative spot views are provided. Initial view demonstrates skin spreaders at the T10-T11 vertebral body level. Second image demonstrates lead wires entering at T11/T10 with electrodes more superiorly the central canal. Lateral projection demonstrates electrode leads in the central canal of the lower thoracic spine. IMPRESSION: Three intraoperative views as above. Electronically Signed   By: Suzy Bouchard M.D.   On: 05/13/2016 09:34   Dg C-arm 61-120 Min  Result Date: 05/13/2016 CLINICAL DATA:  Fluoroscopic imaging provided for spine stimulator placement. 0 minutes and 35 seconds of fluoro time. EXAM: DG C-ARM 61-120 MIN COMPARISON:  None. FINDINGS: Three provided images show placement of spine stimulator leads projecting along the dorsal aspect of the mid to lower thoracic spinal canal. IMPRESSION: Imaging for spine stimulator placement. Electronically Signed   By: Lajean Manes M.D.   On: 05/13/2016 09:35    Disposition: 01-Home or Self Care Pt discharged home Pt will present to clinic in 2 weeks Pt provided post op medication   Follow-up Information    BROOKS,DAHARI D, MD In 2 weeks.   Specialty:  Orthopedic Surgery Why:  If symptoms worsen, For suture removal, For wound re-check Contact information: [address] [place] [address] [phone]             Signed: Valinda Hoar 05/18/2016, 12:41 PM

## 2016-09-06 HISTORY — PX: COLONOSCOPY: SHX174

## 2016-09-06 HISTORY — PX: UPPER GASTROINTESTINAL ENDOSCOPY: SHX188

## 2016-11-24 ENCOUNTER — Ambulatory Visit
Admission: RE | Admit: 2016-11-24 | Discharge: 2016-11-24 | Disposition: A | Discharge status: Home / Self Care | Payer: 59 | Source: Ambulatory Visit | Attending: Unknown Physician Specialty | Admitting: Unknown Physician Specialty

## 2016-11-24 ENCOUNTER — Other Ambulatory Visit: Payer: Self-pay | Admitting: Unknown Physician Specialty

## 2016-11-24 DIAGNOSIS — J329 Chronic sinusitis, unspecified: Secondary | ICD-10-CM

## 2017-04-29 ENCOUNTER — Emergency Department (HOSPITAL_COMMUNITY)
Admission: EM | Admit: 2017-04-29 | Discharge: 2017-04-29 | Disposition: A | Discharge status: Home / Self Care | Payer: 59 | Attending: Emergency Medicine | Admitting: Emergency Medicine

## 2017-04-29 ENCOUNTER — Emergency Department (HOSPITAL_COMMUNITY): Payer: 59

## 2017-04-29 ENCOUNTER — Encounter (HOSPITAL_COMMUNITY): Payer: Self-pay | Admitting: *Deleted

## 2017-04-29 DIAGNOSIS — Z79899 Other long term (current) drug therapy: Secondary | ICD-10-CM | POA: Insufficient documentation

## 2017-04-29 DIAGNOSIS — K219 Gastro-esophageal reflux disease without esophagitis: Secondary | ICD-10-CM | POA: Insufficient documentation

## 2017-04-29 DIAGNOSIS — R42 Dizziness and giddiness: Secondary | ICD-10-CM | POA: Diagnosis not present

## 2017-04-29 DIAGNOSIS — Z794 Long term (current) use of insulin: Secondary | ICD-10-CM | POA: Insufficient documentation

## 2017-04-29 DIAGNOSIS — R079 Chest pain, unspecified: Secondary | ICD-10-CM

## 2017-04-29 DIAGNOSIS — R110 Nausea: Secondary | ICD-10-CM | POA: Diagnosis not present

## 2017-04-29 DIAGNOSIS — E119 Type 2 diabetes mellitus without complications: Secondary | ICD-10-CM | POA: Diagnosis not present

## 2017-04-29 LAB — I-STAT TROPONIN, ED
Troponin i, poc: 0 ng/mL (ref 0.00–0.08)
Troponin i, poc: 0 ng/mL (ref 0.00–0.08)

## 2017-04-29 LAB — BASIC METABOLIC PANEL
Anion gap: 11 (ref 5–15)
BUN: 23 mg/dL — ABNORMAL HIGH (ref 6–20)
CALCIUM: 9.7 mg/dL (ref 8.9–10.3)
CHLORIDE: 106 mmol/L (ref 101–111)
CO2: 23 mmol/L (ref 22–32)
CREATININE: 1.12 mg/dL — AB (ref 0.44–1.00)
GFR calc non Af Amer: 55 mL/min — ABNORMAL LOW (ref >60)
GLUCOSE: 94 mg/dL (ref 65–99)
Potassium: 4.2 mmol/L (ref 3.5–5.1)
Sodium: 140 mmol/L (ref 135–145)

## 2017-04-29 LAB — CBC
HCT: 33.1 % — ABNORMAL LOW (ref 36.0–46.0)
Hemoglobin: 10.9 g/dL — ABNORMAL LOW (ref 12.0–15.0)
MCH: 27.7 pg (ref 26.0–34.0)
MCHC: 32.9 g/dL (ref 30.0–36.0)
MCV: 84 fL (ref 78.0–100.0)
PLATELETS: 319 10*3/uL (ref 150–400)
RBC: 3.94 MIL/uL (ref 3.87–5.11)
RDW: 13.1 % (ref 11.5–15.5)
WBC: 8.3 10*3/uL (ref 4.0–10.5)

## 2017-04-29 MED ORDER — GI COCKTAIL ~~LOC~~
30.0000 mL | Freq: Once | ORAL | Status: AC
  Administered 2017-04-29: 30 mL via ORAL
  Filled 2017-04-29: qty 30

## 2017-04-29 MED ORDER — FAMOTIDINE 20 MG PO TABS: 20.0000 mg | ORAL_TABLET | Freq: Two times a day (BID) | ORAL | 0 refills | Status: DC

## 2017-04-29 NOTE — ED Triage Notes (Signed)
Pt c/o mid radiating CP to R chest onset today @ 6am, pt reports worsening pain at 11am with dizziness & nausea, denies SOB, v/d, A&O x4, pt reports taking x3 baby ASA today pta

## 2017-04-29 NOTE — ED Provider Notes (Signed)
[facility] DEPT Provider Note   CSN: [id number] Arrival date & time: 04/29/17  1221     History   Chief Complaint Chief Complaint  Patient presents with  . Chest Pain    HPI Alexis Black is a 56 y.o. female.   Patient is a 56 yo F with PMH of  DM, HTN, HLD who presented with chest pain. States this morning 6:30am woke up from sleep with nausea associated with chest pain, did have feeling indigestion with bitter taste in her mouth.  Chest pain worsened acutely around 11am, was sharp, was 8-9/10, took ASA81 x3 tablets which provided significant relief, now rates 2/10 and associated with lightheadedness. Similar to episode of chest pain had in the past that was related to feeling angry or stress related. No diaphoresis. No vomiting. Ankles and feet swelling for the past 1 week. Does feel some shortness of breath but thinks that is her baseline. No orthopnea. Follows with Dr. Gwenlyn Found for cardiology for irregular heart rate, states has had echo and stress tests in the past that were negative but that this was years ago.      Past Medical History:  Diagnosis Date  . Deafness in left ear   . Diabetes mellitus without complication ([place])    Type II  . Family history of adverse reaction to anesthesia    mother - PONV  . GERD (gastroesophageal reflux disease)   . Heart palpitations    "with caffine"  . History of bronchitis   . Irregular heart beat   . Numbness and tingling of leg   . PONV (postoperative nausea and vomiting)   . Shortness of breath dyspnea    "with caffiene"   . Sleep apnea    "diagnosed over 10 years ago but I don't wear a CPAP 05/06/16    Patient Active Problem List   Diagnosis Date Noted  . Back pain 05/13/2016  . GERD 09/10/2008  . NAUSEA AND VOMITING 09/10/2008  . DIARRHEA 09/10/2008  . CHANGE IN BOWELS 09/10/2008  . ABDOMINAL PAIN-EPIGASTRIC 09/10/2008  . DIABETES MELLITUS, TYPE II 09/09/2008  . NEPHROPATHY, DIABETIC 09/09/2008    Past Surgical  History:  Procedure Laterality Date  . ABDOMINAL HYSTERECTOMY    . BACK SURGERY  1997, 1999, 2000  . CESAREAN SECTION  1989  . CHOLECYSTECTOMY  1990  . COLONOSCOPY    . ESOPHAGOGASTRODUODENOSCOPY    . INNER EAR SURGERY Left 1983  . PLANTAR FASCIA SURGERY Bilateral   . ROTATOR CUFF REPAIR Bilateral   . SPINAL CORD STIMULATOR INSERTION N/A 05/13/2016   Procedure: LUMBAR SPINAL CORD STIMULATOR INSERTION;  Surgeon: Melina Schools, MD;  Location: [facility];  Service: Orthopedics;  Laterality: N/A;    OB History    No data available       Home Medications    Prior to Admission medications   Medication Sig Start Date End Date Taking? Authorizing Provider  gemfibrozil (LOPID) 600 MG tablet Take 300 mg by mouth 2 (two) times daily before a meal.     [provider]  insulin aspart (NOVOLOG FLEXPEN) 100 UNIT/ML injection Inject 20-31 Units into the skin 3 (three) times daily before meals. Sliding scale as directed     [provider]  insulin detemir (LEVEMIR FLEXPEN) 100 UNIT/ML injection Inject 45 Units into the skin 2 (two) times daily.     [provider]  metFORMIN (GLUMETZA) 500 MG (MOD) 24 hr tablet Take 1,000 mg by mouth 2 (two) times daily with  a meal.    [provider]  methocarbamol (ROBAXIN) 500 MG tablet Take 1 tablet (500 mg total) by mouth 3 (three) times daily as needed for muscle spasms. 05/13/16   Melina Schools, MD  metoprolol tartrate (LOPRESSOR) 25 MG tablet Take 25 mg by mouth daily.    [provider]  ondansetron (ZOFRAN) 4 MG tablet Take 1 tablet (4 mg total) by mouth every 8 (eight) hours as needed for nausea or vomiting. 05/13/16   Melina Schools, MD  oxyCODONE-acetaminophen (PERCOCET) 10-325 MG tablet Take 1 tablet by mouth every 4 (four) hours as needed for pain. 05/13/16   Melina Schools, MD  RABEprazole (ACIPHEX) 20 MG tablet Take 20 mg by mouth daily.    [provider]  valsartan-hydrochlorothiazide (DIOVAN-HCT)  160-12.5 MG per tablet Take 1 tablet by mouth daily.    [provider]    Family History No family history on file. No fam hx of MI.  Social History Social History  Substance Use Topics  . Smoking status: Never Smoker  . Smokeless tobacco: Never Used  . Alcohol use No     Allergies   Actos [pioglitazone]   Review of Systems Review of Systems  Constitutional: Negative for chills and fever.  Respiratory: Negative for chest tightness and shortness of breath.   Cardiovascular: Positive for chest pain. Negative for palpitations.  Gastrointestinal: Positive for nausea. Negative for abdominal pain, constipation, diarrhea and vomiting.  Genitourinary: Negative for dysuria.  Musculoskeletal: Negative for back pain and myalgias.  Neurological: Positive for light-headedness. Negative for headaches.     Physical Exam Updated Vital Signs BP 138/80 (BP Location: Left Arm)   Pulse 76   Temp 97.9 F (36.6 C) (Oral)   Resp 18   SpO2 100%   Physical Exam  Constitutional: She is oriented to person, place, and time. She appears well-developed and well-nourished. No distress.  HENT:  Head: Normocephalic and atraumatic.  Nose: Nose normal.  Eyes: Conjunctivae and EOM are normal.  Neck: Normal range of motion. Neck supple.  Cardiovascular: Normal rate, regular rhythm, normal heart sounds and intact distal pulses.   No murmur heard. Pulmonary/Chest: Effort normal and breath sounds normal. No respiratory distress.  Abdominal: Soft. Bowel sounds are normal. She exhibits no distension. There is no tenderness. There is no rebound and no guarding.  Musculoskeletal: Normal range of motion. She exhibits no edema.  Neurological: She is alert and oriented to person, place, and time.  Skin: Skin is warm and dry. Capillary refill takes less than 2 seconds.  Psychiatric: She has a normal mood and affect.     ED Treatments / Results  Labs (all labs ordered are listed, but only  abnormal results are displayed) Labs Reviewed  BASIC METABOLIC PANEL - Abnormal; Notable for the following:       Result Value   BUN 23 (*)    Creatinine, Ser 1.12 (*)    GFR calc non Af Amer 55 (*)    All other components within normal limits  CBC - Abnormal; Notable for the following:    Hemoglobin 10.9 (*)    HCT 33.1 (*)    All other components within normal limits  I-STAT TROPONIN, ED    EKG  EKG Interpretation None       Radiology Dg Chest 2 View  Result Date: 04/29/2017 CLINICAL DATA:  Chest pain. EXAM: CHEST  2 VIEW COMPARISON:  07/01/2011 FINDINGS: The heart size and mediastinal contours are within normal limits. Both lungs  are clear. The visualized skeletal structures are unremarkable. Calcification in the thoracic aorta. Thoracic spinal neurostimulator in place. IMPRESSION: No active cardiopulmonary disease. Aortic atherosclerosis. Electronically Signed   By: Lorriane Shire M.D.   On: 04/29/2017 12:59    Procedures Procedures (including critical care time)  Medications Ordered in ED Medications  gi cocktail (Maalox,Lidocaine,Donnatal) (not administered)     Initial Impression / Assessment and Plan / ED Course  I have reviewed the triage vital signs and the nursing notes.  Pertinent labs & imaging results that were available during my care of the patient were reviewed by me and considered in my medical decision making (see chart for details).   Patient is a 56yo F with PMH of HTN, HLD, DM who presented to the ED with chest pain. Likely GERD given similar presentation in the past and bitter taste in mouth. Patient is well appearing, vitals stable. EKG and initial troponin neg. However HEART score 3 for for multiple risk factors and age so will recheck troponin. If repeat troponin negative likely can discharge home. Signed out to Dr. Tamera Punt to follow up on repeat troponin.  Final Clinical Impressions(s) / ED Diagnoses   Final diagnoses:  Chest pain, unspecified  type  Gastroesophageal reflux disease, esophagitis presence not specified    New Prescriptions New Prescriptions   FAMOTIDINE (PEPCID) 20 MG TABLET    Take 1 tablet (20 mg total) by mouth 2 (two) times daily.        Bufford Lope, DO 04/29/17 1604    Mesner, Corene Cornea, MD 05/01/17 [phone]

## 2017-04-29 NOTE — ED Notes (Signed)
ED Provider at bedside. 

## 2017-05-05 ENCOUNTER — Ambulatory Visit: Payer: 59 | Admitting: Physician Assistant

## 2017-05-24 ENCOUNTER — Encounter: Payer: Self-pay | Admitting: Physician Assistant

## 2017-05-24 ENCOUNTER — Ambulatory Visit (INDEPENDENT_AMBULATORY_CARE_PROVIDER_SITE_OTHER): Payer: 59 | Admitting: Physician Assistant

## 2017-05-24 VITALS — BP 126/74 | HR 68 | Ht 67.0 in | Wt 199.0 lb

## 2017-05-24 DIAGNOSIS — R1013 Epigastric pain: Secondary | ICD-10-CM

## 2017-05-24 DIAGNOSIS — Z8719 Personal history of other diseases of the digestive system: Secondary | ICD-10-CM | POA: Diagnosis not present

## 2017-05-24 DIAGNOSIS — Z86010 Personal history of colonic polyps: Secondary | ICD-10-CM

## 2017-05-24 DIAGNOSIS — Z1212 Encounter for screening for malignant neoplasm of rectum: Secondary | ICD-10-CM

## 2017-05-24 DIAGNOSIS — Z1211 Encounter for screening for malignant neoplasm of colon: Secondary | ICD-10-CM

## 2017-05-24 MED ORDER — NA SULFATE-K SULFATE-MG SULF 17.5-3.13-1.6 GM/177ML PO SOLN: 1.0000 | ORAL | 0 refills | Status: DC

## 2017-05-24 NOTE — Progress Notes (Signed)
Chief Complaint: Epigastric pain  HPI:  Alexis Black is a 56 year old Caucasian female with a past medical history as listed below including Barrett's esophagus, who was referred to me by Leanna Battles, MD for a complaint of epigastric pain .     Patient follows with Dr. Fuller Plan. Her last colonoscopy was performed 10/11/08 with the finding of a tubular adenoma and recommendations for repeat in 3 years. Patient also had an EGD at that time with biopsies positive for Barrett's esophagus and was told to have a repeat endoscopy in 2 years.    Patient was recently seen in the ED 04/29/17 for chest pain. At that time, patient described indigestion around 6:30 AM with a bitter taste in her mouth and chest pain afterwards around 11 AM which was sharp rate and rates as a 9/10. Patient took aspirin 813 tablets which provided significant relief and presented to the ER with a 2/10 pain associated with lightheadedness. Workup including BMP, CBC and troponin were all normal. Chest x-ray showed no active cardiopulmonary disease. Patient was discharged with Famotidine 20 mg twice a day.   Today, the patient presents clinic and tells me that she is no longer having epigastric or chest pain. She is using her AcipHex 20 mg daily and occasionally uses a Zantac or Mylanta if she has any epigastric discomfort or breakthrough reflux symptoms. Overall, she is much better and relates this acute chest pain/epigastric pain on a stressful situation with her husband. She tells me over an over that "if I could've just hit him it would be better". Patient does tell me today that whenever she drinks a soda over the past month she has increased gas and bloating in her abdomen."This never happened before". Patient has decreased her intake of sodas and has done better.   Patient denies fever, chills, blood in her stool, change in bowel habits, weight loss, fatigue, anorexia, nausea, vomiting or symptoms that awaken her at night.  Past  Medical History:  Diagnosis Date  . Deafness in left ear   . Diabetes mellitus without complication ([place])    Type II  . Family history of adverse reaction to anesthesia    mother - PONV  . GERD (gastroesophageal reflux disease)   . Heart palpitations    "with caffine"  . History of bronchitis   . Irregular heart beat   . Numbness and tingling of leg   . PONV (postoperative nausea and vomiting)   . Shortness of breath dyspnea    "with caffiene"   . Sleep apnea    "diagnosed over 10 years ago but I don't wear a CPAP 05/06/16    Past Surgical History:  Procedure Laterality Date  . ABDOMINAL HYSTERECTOMY    . BACK SURGERY  1997, 1999, 2000  . CESAREAN SECTION  1989  . CHOLECYSTECTOMY  1990  . COLONOSCOPY    . ESOPHAGOGASTRODUODENOSCOPY    . INNER EAR SURGERY Left 1983  . PLANTAR FASCIA SURGERY Bilateral   . ROTATOR CUFF REPAIR Bilateral   . SPINAL CORD STIMULATOR INSERTION N/A 05/13/2016   Procedure: LUMBAR SPINAL CORD STIMULATOR INSERTION;  Surgeon: Melina Schools, MD;  Location: [facility];  Service: Orthopedics;  Laterality: N/A;    Current Outpatient Prescriptions  Medication Sig Dispense Refill  . gemfibrozil (LOPID) 600 MG tablet Take 300 mg by mouth 2 (two) times daily before a meal.     . insulin aspart (NOVOLOG FLEXPEN) 100 UNIT/ML injection Inject 20-31 Units into the skin 3 (three) times  daily before meals. Sliding scale as directed     . insulin detemir (LEVEMIR FLEXPEN) 100 UNIT/ML injection Inject 45 Units into the skin 2 (two) times daily.     . metFORMIN (GLUMETZA) 500 MG (MOD) 24 hr tablet Take 1,000 mg by mouth 2 (two) times daily with a meal.    . methocarbamol (ROBAXIN) 500 MG tablet Take 1 tablet (500 mg total) by mouth 3 (three) times daily as needed for muscle spasms. 60 tablet 0  . metoprolol tartrate (LOPRESSOR) 25 MG tablet Take 25 mg by mouth daily.    . Multiple Vitamin (MULTIVITAMIN) tablet Take 1 tablet by mouth daily.    . ondansetron (ZOFRAN) 4 MG tablet  Take 1 tablet (4 mg total) by mouth every 8 (eight) hours as needed for nausea or vomiting. 20 tablet 0  . oxyCODONE-acetaminophen (PERCOCET) 10-325 MG tablet Take 1 tablet by mouth every 4 (four) hours as needed for pain. 60 tablet 0  . progesterone (PROMETRIUM) 100 MG capsule Take 100 mg by mouth at bedtime.  12  . RABEprazole (ACIPHEX) 20 MG tablet Take 20 mg by mouth daily.    . TRULICITY 1.32 GM/0.1UU SOPN Inject 0.75 mg into the skin once a week. Tuesdays  4  . valsartan-hydrochlorothiazide (DIOVAN-HCT) 160-12.5 MG per tablet Take 1 tablet by mouth daily.     No current facility-administered medications for this visit.     Allergies as of 05/24/2017 - Review Complete 05/24/2017  Allergen Reaction Noted  . Actos [pioglitazone] Other (See Comments) 05/06/2016    Family History  Problem Relation Age of Onset  . Diabetes Mother        Carcinomoid tumors all over  . Heart disease Mother   . Diabetes Father     Social History   Social History  . Marital status: Married    Spouse name: N/A  . Number of children: 1  . Years of education: N/A   Occupational History  . Not on file.   Social History Main Topics  . Smoking status: Never Smoker  . Smokeless tobacco: Never Used  . Alcohol use No  . Drug use: No  . Sexual activity: Not on file   Other Topics Concern  . Not on file   Social History Narrative  . No narrative on file    Review of Systems:    Constitutional: No weight loss, fever or chills Skin: No rash  Cardiovascular:See HPI Respiratory: No SOB  Gastrointestinal: See HPI and otherwise negative Genitourinary: No dysuria Neurological: No headache Musculoskeletal: No new muscle or joint pain Hematologic: No bleeding or bruising Psychiatric: No history of depression or anxiety   Physical Exam:  Vital signs: BP 126/74   Pulse 68   Ht 5\' 7"  (1.702 m)   Wt 199 lb (90.3 kg)   BMI 31.17 kg/m    Constitutional:   Pleasant Caucasian female appears to be  in NAD, Well developed, Well nourished, alert and cooperative Head:  Normocephalic and atraumatic. Eyes:   PEERL, EOMI. No icterus. Conjunctiva pink. Ears:  Normal auditory acuity. Neck:  Supple Throat: Oral cavity and pharynx without inflammation, swelling or lesion.  Respiratory: Respirations even and unlabored. Lungs clear to auscultation bilaterally.   No wheezes, crackles, or rhonchi.  Cardiovascular: Normal S1, S2. No MRG. Regular rate and rhythm. No peripheral edema, cyanosis or pallor.  Gastrointestinal:  Soft, nondistended, nontender. No rebound or guarding. Normal bowel sounds. No appreciable masses or hepatomegaly. Rectal:  Not performed.  Msk:  Symmetrical without gross deformities. Without edema, no deformity or joint abnormality.  Neurologic:  Alert and  oriented x4;  grossly normal neurologically.  Skin:   Dry and intact without significant lesions or rashes. Psychiatric: Demonstrates good judgement and reason without abnormal affect or behaviors.  MOST RECENT LABS AND IMAGING: CBC    Component Value Date/Time   WBC 8.3 04/29/2017 1236   RBC 3.94 04/29/2017 1236   HGB 10.9 (L) 04/29/2017 1236   HCT 33.1 (L) 04/29/2017 1236   PLT 319 04/29/2017 1236   MCV 84.0 04/29/2017 1236   MCH 27.7 04/29/2017 1236   MCHC 32.9 04/29/2017 1236   RDW 13.1 04/29/2017 1236   LYMPHSABS 0.7 11/19/2012 1428   MONOABS 0.6 11/19/2012 1428   EOSABS 0.2 11/19/2012 1428   BASOSABS 0.0 11/19/2012 1428    CMP     Component Value Date/Time   NA 140 04/29/2017 1236   K 4.2 04/29/2017 1236   CL 106 04/29/2017 1236   CO2 23 04/29/2017 1236   GLUCOSE 94 04/29/2017 1236   BUN 23 (H) 04/29/2017 1236   CREATININE 1.12 (H) 04/29/2017 1236   CALCIUM 9.7 04/29/2017 1236   PROT 8.0 11/19/2012 1428   ALBUMIN 4.1 11/19/2012 1428   AST 14 11/19/2012 1428   ALT 13 11/19/2012 1428   ALKPHOS 80 11/19/2012 1428   BILITOT 0.3 11/19/2012 1428   GFRNONAA 55 (L) 04/29/2017 1236   GFRAA >60 04/29/2017  1236    Assessment: 1. Epigastric pain: Relieved with AcipHex and Zantac; patient relates to stressful situation 2. History of Barrett's esophagus: Last EGD 2010 with recommendations for repeat in 2 years due to Barrett's esophagus 3. History of adenomatous polyps: Last colonoscopy 2010 with finding of a tubular adenoma, recommendations for repeat in 3 years  Plan: 1. Patient is past due for surveillance EGD and colonoscopy due to her history of Barrett's esophagus and adenomatous polyps. Scheduled patient for procedures with Dr. Fuller Plan in the [facility]. Did discuss risks, benefits, limitations and alternatives and the patient agrees to proceed. 2. Patient to continue her AcipHex 20 mg daily and Zantac as needed 3. Patient to return to clinic per recommendations from Dr. Fuller Plan after time of procedures.  Ellouise Newer, PA-C [facility] 05/24/2017, 11:08 AM  Cc: Leanna Battles, MD

## 2017-05-24 NOTE — Progress Notes (Signed)
Reviewed and agree with initial management plan.  Karsynn Deweese T. Eldean Nanna, MD FACG 

## 2017-05-24 NOTE — Patient Instructions (Signed)

## 2017-06-16 ENCOUNTER — Telehealth: Payer: Self-pay | Admitting: Gastroenterology

## 2017-06-16 MED ORDER — NA SULFATE-K SULFATE-MG SULF 17.5-3.13-1.6 GM/177ML PO SOLN: 1.0000 | ORAL | 0 refills | Status: DC

## 2017-06-16 NOTE — Telephone Encounter (Signed)
Suprep resent to [employer].

## 2017-06-20 ENCOUNTER — Encounter: Payer: Self-pay | Admitting: Gastroenterology

## 2017-06-29 ENCOUNTER — Encounter: Payer: Self-pay | Admitting: Gastroenterology

## 2017-06-29 ENCOUNTER — Ambulatory Visit (AMBULATORY_SURGERY_CENTER): Payer: 59 | Admitting: Gastroenterology

## 2017-06-29 VITALS — BP 118/65 | HR 63 | Temp 98.4°F | Resp 10 | Ht 67.0 in | Wt 199.0 lb

## 2017-06-29 DIAGNOSIS — K449 Diaphragmatic hernia without obstruction or gangrene: Secondary | ICD-10-CM

## 2017-06-29 DIAGNOSIS — Z860101 Personal history of adenomatous and serrated colon polyps: Secondary | ICD-10-CM

## 2017-06-29 DIAGNOSIS — D122 Benign neoplasm of ascending colon: Secondary | ICD-10-CM

## 2017-06-29 DIAGNOSIS — Z86010 Personal history of colonic polyps: Secondary | ICD-10-CM

## 2017-06-29 DIAGNOSIS — D123 Benign neoplasm of transverse colon: Secondary | ICD-10-CM | POA: Diagnosis not present

## 2017-06-29 DIAGNOSIS — K2270 Barrett's esophagus without dysplasia: Secondary | ICD-10-CM | POA: Diagnosis not present

## 2017-06-29 DIAGNOSIS — K222 Esophageal obstruction: Secondary | ICD-10-CM | POA: Diagnosis not present

## 2017-06-29 DIAGNOSIS — K3189 Other diseases of stomach and duodenum: Secondary | ICD-10-CM

## 2017-06-29 MED ORDER — SODIUM CHLORIDE 0.9 % IV SOLN: 500.0000 mL | INTRAVENOUS | Status: DC

## 2017-06-29 NOTE — Progress Notes (Signed)
Report given to PACU, vss 

## 2017-06-29 NOTE — Op Note (Signed)
[facility] Patient Name: Alexis Black Procedure Date: 06/29/2017 2:56 PM MRN: [id number] Endoscopist: Ladene Artist , MD Age: 56 Referring MD:  Date of Birth: 1960/10/28 Gender: Female Account #: [id number] Procedure:                Upper GI endoscopy Indications:              Follow-up of Barrett's esophagus Medicines:                Monitored Anesthesia Care Procedure:                Pre-Anesthesia Assessment:                           - Prior to the procedure, a History and Physical                            was performed, and patient medications and                            allergies were reviewed. The patient's tolerance of                            previous anesthesia was also reviewed. The risks                            and benefits of the procedure and the sedation                            options and risks were discussed with the patient.                            All questions were answered, and informed consent                            was obtained. Prior Anticoagulants: The patient has                            taken no previous anticoagulant or antiplatelet                            agents. ASA Grade Assessment: II - A patient with                            mild systemic disease. After reviewing the risks                            and benefits, the patient was deemed in                            satisfactory condition to undergo the procedure.                           After obtaining informed consent, the endoscope was  passed under direct vision. Throughout the                            procedure, the patient's blood pressure, pulse, and                            oxygen saturations were monitored continuously. The                            Endoscope was introduced through the mouth, and                            advanced to the second part of duodenum. The upper                            GI endoscopy was  accomplished without difficulty.                            The patient tolerated the procedure well. Scope In: Scope Out: Findings:                 The cardia and gastric fundus were normal on                            retroflexion.                           There were esophageal mucosal changes secondary to                            established short-segment Barrett's disease present                            in the distal esophagus. The maximum longitudinal                            extent of these mucosal changes was 1 cm in length.                            Barrett's appeared circumfirential. Mucosa was                            biopsied with a cold forceps for histology. One                            specimen bottle was sent to pathology.                           One mild benign-appearing, intrinsic stenosis was                            found at the gastroesophageal junction. This                            measured 1.5 cm (inner diameter)  and was traversed.                           The exam of the esophagus was otherwise normal.                           A small hiatal hernia was present.                           The exam of the stomach was otherwise normal.                           A single 5 mm mucosal nodule with a localized                            distribution was found in the second portion of the                            duodenum. Biopsies were taken with a cold forceps                            for histology.                           The duodenal bulb was normal. Complications:            No immediate complications. Estimated Blood Loss:     Estimated blood loss: none. Impression:               - Esophageal mucosal changes secondary to                            established short-segment Barrett's disease.                            Biopsied.                           - Esophageal stricture.                           - Small hiatal hernia.                            - Single duodenal nodule. Biopsied. Recommendation:           - Patient has a contact number available for                            emergencies. The signs and symptoms of potential                            delayed complications were discussed with the                            patient. Return to normal activities tomorrow.  Written discharge instructions were provided to the                            patient.                           - Resume previous diet.                           - Continue present medications.                           - Await pathology results.                           - Repeat upper endoscopy for surveillance, timing                            based on pathology results. Ladene Artist, MD 06/29/2017 3:42:05 PM This report has been signed electronically.

## 2017-06-29 NOTE — Patient Instructions (Signed)
YOU HAD AN ENDOSCOPIC PROCEDURE TODAY AT THE [facility]:   Refer to the procedure report that was given to you for any specific questions about what was found during the examination.  If the procedure report does not answer your questions, please call your gastroenterologist to clarify.  If you requested that your care partner not be given the details of your procedure findings, then the procedure report has been included in a sealed envelope for you to review at your convenience later.  YOU SHOULD EXPECT: Some feelings of bloating in the abdomen. Passage of more gas than usual.  Walking can help get rid of the air that was put into your GI tract during the procedure and reduce the bloating. If you had a lower endoscopy (such as a colonoscopy or flexible sigmoidoscopy) you may notice spotting of blood in your stool or on the toilet paper. If you underwent a bowel prep for your procedure, you may not have a normal bowel movement for a few days.  Please Note:  You might notice some irritation and congestion in your nose or some drainage.  This is from the oxygen used during your procedure.  There is no need for concern and it should clear up in a day or so.  SYMPTOMS TO REPORT IMMEDIATELY:   Following lower endoscopy (colonoscopy or flexible sigmoidoscopy):  Excessive amounts of blood in the stool  Significant tenderness or worsening of abdominal pains  Swelling of the abdomen that is new, acute  Fever of 100F or higher   Following upper endoscopy (EGD)  Vomiting of blood or coffee ground material  New chest pain or pain under the shoulder blades  Painful or persistently difficult swallowing  New shortness of breath  Fever of 100F or higher  Black, tarry-looking stools  For urgent or emergent issues, a gastroenterologist can be reached at any hour by calling [phone].   DIET:  We do recommend a small meal at first, but then you may proceed to your regular diet.  Drink  plenty of fluids but you should avoid alcoholic beverages for 24 hours.  MEDICATIONS: Continue present medications.  Please see handouts given to you by your recovery nurse.  ACTIVITY:  You should plan to take it easy for the rest of today and you should NOT DRIVE or use heavy machinery until tomorrow (because of the sedation medicines used during the test).    FOLLOW UP: Our staff will call the number listed on your records the next business day following your procedure to check on you and address any questions or concerns that you may have regarding the information given to you following your procedure. If we do not reach you, we will leave a message.  However, if you are feeling well and you are not experiencing any problems, there is no need to return our call.  We will assume that you have returned to your regular daily activities without incident.  If any biopsies were taken you will be contacted by phone or by letter within the next 1-3 weeks.  Please call us at [phone] if you have not heard about the biopsies in 3 weeks.   Thank you for allowing us to provide for your healthcare needs today.   SIGNATURES/CONFIDENTIALITY: You and/or your care partner have signed paperwork which will be entered into your electronic medical record.  These signatures attest to the fact that that the information above on your After Visit Summary has been reviewed and is   understood.  Full responsibility of the confidentiality of this discharge information lies with you and/or your care-partner. 

## 2017-06-29 NOTE — Progress Notes (Signed)
Pt's states no medical or surgical changes since previsit or office visit. 

## 2017-06-29 NOTE — Progress Notes (Signed)
Called to room to assist during endoscopic procedure.  Patient ID and intended procedure confirmed with present staff. Received instructions for my participation in the procedure from the performing physician. maw 

## 2017-06-29 NOTE — Progress Notes (Signed)
Spinal stimulator middle of back.  Control on right hip.  Freestyle Libre upper right arm.

## 2017-06-29 NOTE — Op Note (Signed)
[facility] Patient Name: Alexis Black Procedure Date: 06/29/2017 2:56 PM MRN: [id number] Endoscopist: Ladene Artist , MD Age: 56 Referring MD:  Date of Birth: 1961/08/06 Gender: Female Account #: [id number] Procedure:                Colonoscopy Indications:              Surveillance: Personal history of adenomatous                            polyps on last colonoscopy > 5 years ago.                            Tubulovillous adenoma in 2010. Medicines:                Monitored Anesthesia Care Procedure:                Pre-Anesthesia Assessment:                           - Prior to the procedure, a History and Physical                            was performed, and patient medications and                            allergies were reviewed. The patient's tolerance of                            previous anesthesia was also reviewed. The risks                            and benefits of the procedure and the sedation                            options and risks were discussed with the patient.                            All questions were answered, and informed consent                            was obtained. Prior Anticoagulants: The patient has                            taken no previous anticoagulant or antiplatelet                            agents. ASA Grade Assessment: II - A patient with                            mild systemic disease. After reviewing the risks                            and benefits, the patient was deemed in  satisfactory condition to undergo the procedure.                           After obtaining informed consent, the colonoscope                            was passed under direct vision. Throughout the                            procedure, the patient's blood pressure, pulse, and                            oxygen saturations were monitored continuously. The                            Model PCF-[id number]) scope was  introduced                            through the anus and advanced to the the cecum,                            identified by appendiceal orifice and ileocecal                            valve. The ileocecal valve, appendiceal orifice,                            and rectum were photographed. The quality of the                            bowel preparation was good. The colonoscopy was                            performed without difficulty. The patient tolerated                            the procedure well. Scope In: 3:07:32 PM Scope Out: 3:22:44 PM Scope Withdrawal Time: 0 hours 12 minutes 1 second  Total Procedure Duration: 0 hours 15 minutes 12 seconds  Findings:                 The perianal and digital rectal examinations were                            normal.                           A 7 mm polyp was found in the transverse colon. The                            polyp was sessile. The polyp was removed with a                            cold snare. Resection and retrieval were complete.  Two sessile polyps were found in the transverse                            colon and ascending colon. The polyps were 4 mm in                            size. These polyps were removed with a cold biopsy                            forceps. Resection and retrieval were complete.                           Internal hemorrhoids were found during                            retroflexion. The hemorrhoids were small and Grade                            I (internal hemorrhoids that do not prolapse).                           The exam was otherwise without abnormality on                            direct and retroflexion views. Complications:            No immediate complications. Estimated blood loss:                            None. Estimated Blood Loss:     Estimated blood loss: none. Impression:               - One 7 mm polyp in the transverse colon, removed                             with a cold snare. Resected and retrieved.                           - Two 4 mm polyps in the transverse colon and in                            the ascending colon, removed with a cold biopsy                            forceps. Resected and retrieved.                           - Internal hemorrhoids.                           - The examination was otherwise normal on direct                            and retroflexion views. Recommendation:           -  Repeat colonoscopy in 5 years for surveillance.                           - Patient has a contact number available for                            emergencies. The signs and symptoms of potential                            delayed complications were discussed with the                            patient. Return to normal activities tomorrow.                            Written discharge instructions were provided to the                            patient.                           - Resume previous diet.                           - Continue present medications.                           - Await pathology results. Ladene Artist, MD 06/29/2017 3:35:53 PM This report has been signed electronically.

## 2017-06-30 ENCOUNTER — Telehealth: Payer: Self-pay

## 2017-06-30 NOTE — Telephone Encounter (Signed)
  Follow up Call-  Call back number 06/29/2017  Post procedure Call Back phone  # [phone]  Permission to leave phone message Yes  Some recent data might be hidden     Patient questions:  Do you have a fever, pain , or abdominal swelling? No. Pain Score  0 *  Have you tolerated food without any problems? Yes.    Have you been able to return to your normal activities? Yes.    Do you have any questions about your discharge instructions: Diet   No. Medications  No. Follow up visit  No.  Do you have questions or concerns about your Care? No.  Actions: * If pain score is 4 or above: No action needed, pain <4.

## 2017-07-15 ENCOUNTER — Encounter: Payer: Self-pay | Admitting: Gastroenterology

## 2017-09-16 DIAGNOSIS — G8929 Other chronic pain: Secondary | ICD-10-CM | POA: Insufficient documentation

## 2018-05-18 ENCOUNTER — Other Ambulatory Visit: Payer: Self-pay

## 2018-05-18 ENCOUNTER — Emergency Department (HOSPITAL_COMMUNITY)
Admission: EM | Admit: 2018-05-18 | Discharge: 2018-05-18 | Disposition: A | Discharge status: Home / Self Care | Payer: 59 | Attending: Emergency Medicine | Admitting: Emergency Medicine

## 2018-05-18 ENCOUNTER — Encounter (HOSPITAL_COMMUNITY): Payer: Self-pay | Admitting: *Deleted

## 2018-05-18 DIAGNOSIS — Z79899 Other long term (current) drug therapy: Secondary | ICD-10-CM | POA: Diagnosis not present

## 2018-05-18 DIAGNOSIS — Y939 Activity, unspecified: Secondary | ICD-10-CM | POA: Diagnosis not present

## 2018-05-18 DIAGNOSIS — S60462A Insect bite (nonvenomous) of right middle finger, initial encounter: Secondary | ICD-10-CM | POA: Insufficient documentation

## 2018-05-18 DIAGNOSIS — Y929 Unspecified place or not applicable: Secondary | ICD-10-CM | POA: Insufficient documentation

## 2018-05-18 DIAGNOSIS — W57XXXA Bitten or stung by nonvenomous insect and other nonvenomous arthropods, initial encounter: Secondary | ICD-10-CM | POA: Insufficient documentation

## 2018-05-18 DIAGNOSIS — Z794 Long term (current) use of insulin: Secondary | ICD-10-CM | POA: Diagnosis not present

## 2018-05-18 DIAGNOSIS — L089 Local infection of the skin and subcutaneous tissue, unspecified: Secondary | ICD-10-CM | POA: Insufficient documentation

## 2018-05-18 DIAGNOSIS — S60464A Insect bite (nonvenomous) of right ring finger, initial encounter: Secondary | ICD-10-CM | POA: Insufficient documentation

## 2018-05-18 DIAGNOSIS — Y999 Unspecified external cause status: Secondary | ICD-10-CM | POA: Insufficient documentation

## 2018-05-18 DIAGNOSIS — E119 Type 2 diabetes mellitus without complications: Secondary | ICD-10-CM | POA: Diagnosis not present

## 2018-05-18 MED ORDER — TRIAMCINOLONE ACETONIDE 0.1 % EX CREA: 1.0000 "application " | TOPICAL_CREAM | Freq: Two times a day (BID) | CUTANEOUS | 0 refills | Status: DC

## 2018-05-18 MED ORDER — SULFAMETHOXAZOLE-TRIMETHOPRIM 800-160 MG PO TABS: 1.0000 | ORAL_TABLET | Freq: Two times a day (BID) | ORAL | 0 refills | Status: AC

## 2018-05-18 NOTE — ED Triage Notes (Signed)
Pt reports possible spider bite between her R middle and ring finger.  States she noticed it after taking a shower last week Monday.  It has blistered, swollen but is better today.  However, she is unable to move affected fingers, pain radiates up to her elbow.

## 2018-05-18 NOTE — ED Provider Notes (Signed)
[facility] DEPT Provider Note   CSN: [id number] Arrival date & time: 05/18/18  1710     History   Chief Complaint Chief Complaint  Patient presents with  . Insect Bite    HPI Alexis Black is a 57 y.o. female who presents to the ED with c/o possible spider bite. The area is between her right middle and ring finer. Patent states she noted the area after taking a shower last week. The area has now blistered and swollen. Patient reports the pain now radiates to the elbow. Patient denies fever or chills, abdominal pain, n/v/d or other problems. Patient reports that the area looks better than it did when the blisters were there. She states that someone told her she could get scaring and not be able to move her fingers.   HPI  Past Medical History:  Diagnosis Date  . Deafness in left ear   . Diabetes mellitus without complication ([place])    Type II  . Family history of adverse reaction to anesthesia    mother - PONV  . GERD (gastroesophageal reflux disease)   . Heart palpitations    "with caffine"  . History of bronchitis   . Irregular heart beat   . Numbness and tingling of leg   . PONV (postoperative nausea and vomiting)   . Shortness of breath dyspnea    "with caffiene"   . Sleep apnea    "diagnosed over 10 years ago but I don't wear a CPAP 05/06/16    Patient Active Problem List   Diagnosis Date Noted  . Back pain 05/13/2016  . GERD 09/10/2008  . NAUSEA AND VOMITING 09/10/2008  . DIARRHEA 09/10/2008  . CHANGE IN BOWELS 09/10/2008  . ABDOMINAL PAIN-EPIGASTRIC 09/10/2008  . DIABETES MELLITUS, TYPE II 09/09/2008  . NEPHROPATHY, DIABETIC 09/09/2008    Past Surgical History:  Procedure Laterality Date  . ABDOMINAL HYSTERECTOMY    . BACK SURGERY  1997, 1999, 2000  . CESAREAN SECTION  1989  . CHOLECYSTECTOMY  1990  . COLONOSCOPY    . ESOPHAGOGASTRODUODENOSCOPY    . INNER EAR SURGERY Left 1983  . PLANTAR FASCIA SURGERY Bilateral   .  ROTATOR CUFF REPAIR Bilateral   . SPINAL CORD STIMULATOR INSERTION N/A 05/13/2016   Procedure: LUMBAR SPINAL CORD STIMULATOR INSERTION;  Surgeon: Melina Schools, MD;  Location: [facility];  Service: Orthopedics;  Laterality: N/A;     OB History   None      Home Medications    Prior to Admission medications   Medication Sig Start Date End Date Taking? Authorizing Provider  amLODipine (NORVASC) 2.5 MG tablet Take 2.5 mg by mouth daily. 03/27/18  Yes [provider]  gemfibrozil (LOPID) 600 MG tablet Take 300 mg by mouth 2 (two) times daily before a meal.    Yes [provider]  insulin aspart (NOVOLOG FLEXPEN) 100 UNIT/ML injection Inject 20-31 Units into the skin 3 (three) times daily before meals. Sliding scale as directed    Yes [provider]  insulin detemir (LEVEMIR FLEXPEN) 100 UNIT/ML injection Inject 45 Units into the skin 2 (two) times daily.    Yes [provider]  metFORMIN (GLUMETZA) 500 MG (MOD) 24 hr tablet Take 1,000 mg by mouth 2 (two) times daily with a meal.   Yes [provider]  metoprolol tartrate (LOPRESSOR) 25 MG tablet Take 25 mg by mouth daily.   Yes [provider]  Multiple Vitamin (MULTIVITAMIN) tablet Take 1 tablet by  mouth daily.   Yes [provider]  progesterone (PROMETRIUM) 100 MG capsule Take 100 mg by mouth at bedtime. 04/21/17  Yes [provider]  RABEprazole (ACIPHEX) 20 MG tablet Take 20 mg by mouth daily.   Yes [provider]  valsartan-hydrochlorothiazide (DIOVAN-HCT) 160-12.5 MG per tablet Take 1 tablet by mouth daily.   Yes [provider]  Na Sulfate-K Sulfate-Mg Sulf (SUPREP BOWEL PREP KIT) 17.5-3.13-1.6 GM/180ML SOLN Take 1 kit by mouth as directed. Patient not taking: Reported on 05/18/2018 06/16/17   Levin Erp, PA  propranolol (INDERAL) 10 MG tablet Take 10 mg by mouth 3 (three) times daily.    [provider]  sulfamethoxazole-trimethoprim  (BACTRIM DS,SEPTRA DS) 800-160 MG tablet Take 1 tablet by mouth 2 (two) times daily for 7 days. 05/18/18 05/25/18  Ashley Murrain, NP  triamcinolone cream (KENALOG) 0.1 % Apply 1 application topically 2 (two) times daily. 05/18/18   Ashley Murrain, NP    Family History Family History  Problem Relation Age of Onset  . Diabetes Mother        Carcinomoid tumors all over  . Heart disease Mother   . Diabetes Father     Social History Social History   Tobacco Use  . Smoking status: Never Smoker  . Smokeless tobacco: Never Used  Substance Use Topics  . Alcohol use: No  . Drug use: No     Allergies   Actos [pioglitazone]   Review of Systems Review of Systems  Musculoskeletal: Positive for arthralgias.  Skin: Positive for wound.  All other systems reviewed and are negative.    Physical Exam Updated Vital Signs BP (!) 145/93 (BP Location: Left Arm)   Pulse 84   Temp 98.5 F (36.9 C) (Oral)   Resp 16   Ht _0  (1.702 m)   Wt 89.8 kg   SpO2 100%   BMI 31.01 kg/m   Physical Exam  Constitutional: She appears well-developed and well-nourished. No distress.  HENT:  Head: Normocephalic.  Eyes: EOM are normal.  Neck: Neck supple.  Cardiovascular: Normal rate.  Pulmonary/Chest: Effort normal.  Musculoskeletal: Normal range of motion.       Right hand: She exhibits tenderness. She exhibits normal capillary refill. Decreased range of motion: due to pain. Swelling: minimal. Normal sensation noted. Normal strength noted.       Hands: Approximately 1 cm area of dry skin that is peeling on the dorsum of the right hand. Mild erythema, no red streaking, no drainage. Radial pulse 2+, adequate circulation.  Neurological: She is alert.  Skin: Skin is warm and dry.  Psychiatric: She has a normal mood and affect.  Nursing note and vitals reviewed.    ED Treatments / Results  Labs (all labs ordered are listed, but only abnormal results are displayed) Labs Reviewed - No data to  display Radiology No results found.  Procedures Procedures (including critical care time)  Medications Ordered in ED Medications - No data to display   Initial Impression / Assessment and Plan / ED Course  I have reviewed the triage vital signs and the nursing notes. 57 y.o. female with possible spider bite to the right hand stable for d/c without fever and does not appear toxic. I discussed this case with Dr. Tomi Bamberger. Will d/c patient home with Kenalog Cream and Bactrim DS. Referral to hand if symptoms are not improving.   Final Clinical Impressions(s) / ED Diagnoses   Final diagnoses:  Bug bite with infection,  initial encounter    ED Discharge Orders         Ordered    sulfamethoxazole-trimethoprim (BACTRIM DS,SEPTRA DS) 800-160 MG tablet  2 times daily     05/18/18 1900    triamcinolone cream (KENALOG) 0.1 %  2 times daily     05/18/18 1900           Janit Bern Monroe, NP 05/18/18 2156    Dorie Rank, MD 05/19/18 0003

## 2018-05-18 NOTE — Discharge Instructions (Signed)
We are giving you antibiotics and cream for your hand. If your symptoms are not improving call Dr. Biagio Borg office for follow up.  Return here as needed.

## 2018-06-01 ENCOUNTER — Encounter

## 2018-06-01 ENCOUNTER — Ambulatory Visit: Payer: 59 | Admitting: Gastroenterology

## 2018-09-07 IMAGING — DX DG CHEST 2V
2 series · 2 of 2 positions shown · non-contrast
Comparison: 07/01/2011

CLINICAL DATA: Chest pain.

EXAM:
CHEST  2 VIEW

[chest pa]
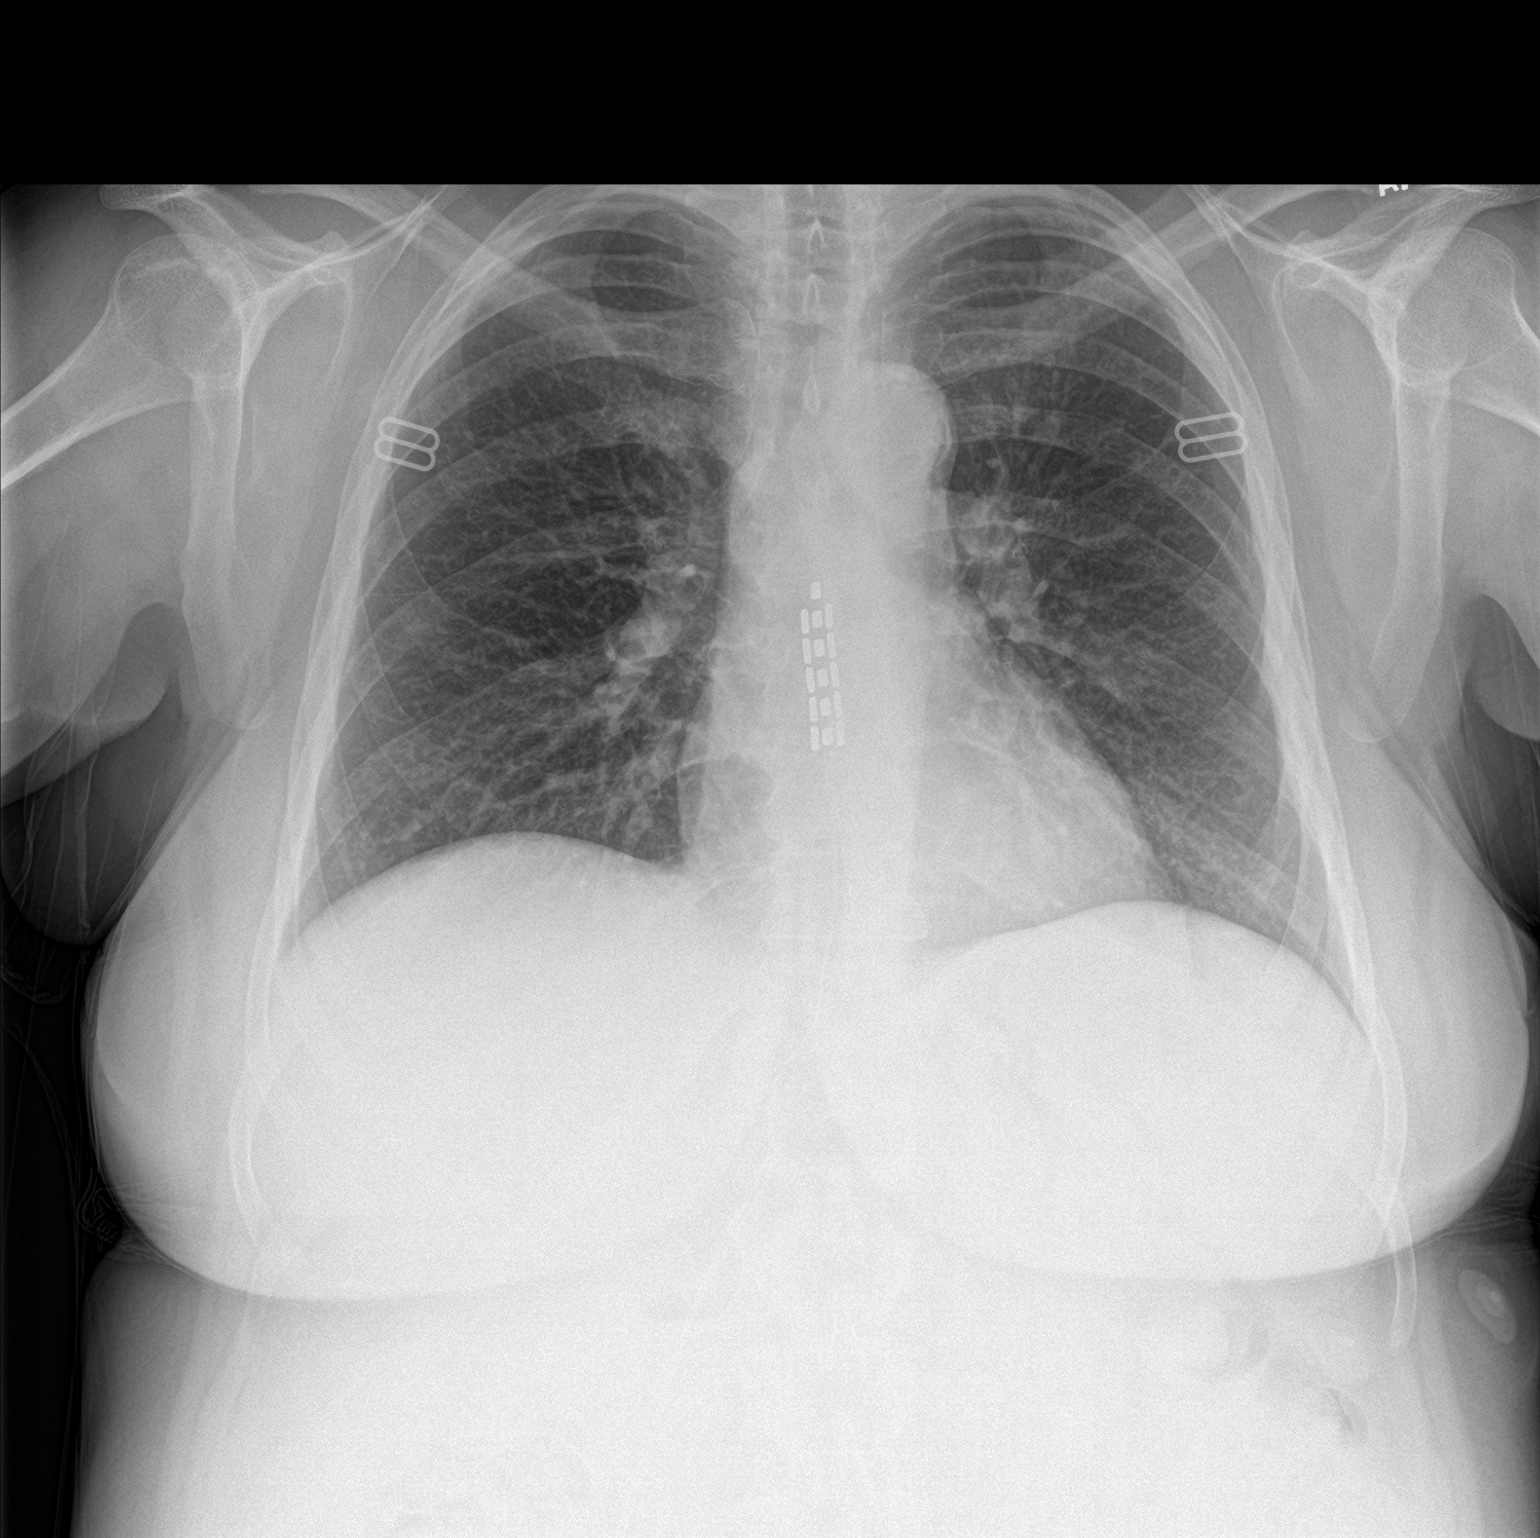

[chest lat]
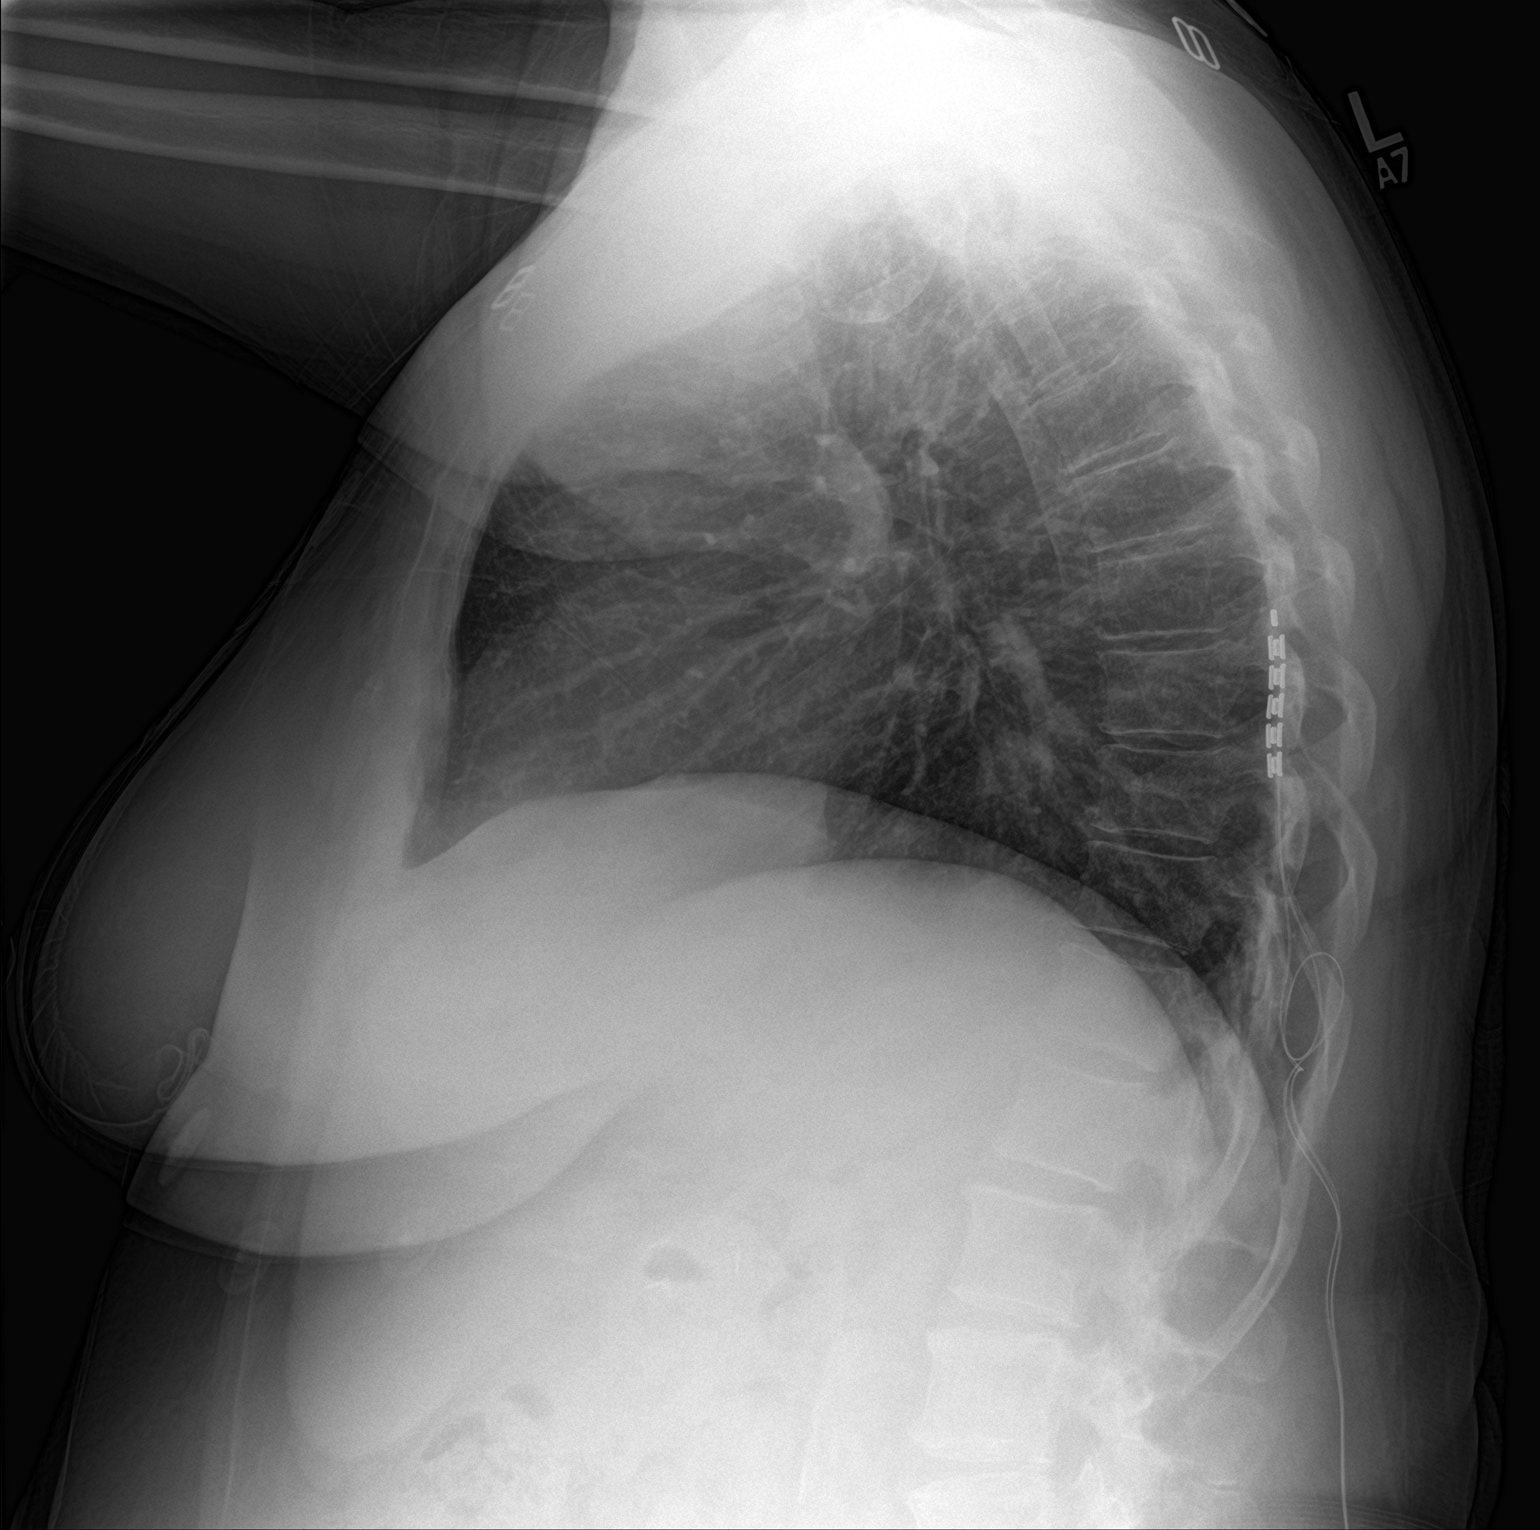

[2 of 2 positions shown; findings below may reference images not displayed]

FINDINGS: The heart size and mediastinal contours are within normal limits.
Both lungs are clear. The visualized skeletal structures are
unremarkable. Calcification in the thoracic aorta. Thoracic spinal
neurostimulator in place.
IMPRESSION: No active cardiopulmonary disease.

Aortic atherosclerosis.

## 2018-09-14 ENCOUNTER — Other Ambulatory Visit: Payer: Self-pay | Admitting: Internal Medicine

## 2018-09-14 DIAGNOSIS — R0789 Other chest pain: Secondary | ICD-10-CM

## 2018-09-15 ENCOUNTER — Ambulatory Visit
Admission: RE | Admit: 2018-09-15 | Discharge: 2018-09-15 | Disposition: A | Discharge status: Home / Self Care | Payer: 59 | Source: Ambulatory Visit | Attending: Internal Medicine | Admitting: Internal Medicine

## 2018-09-15 DIAGNOSIS — R0789 Other chest pain: Secondary | ICD-10-CM

## 2018-09-19 ENCOUNTER — Other Ambulatory Visit (INDEPENDENT_AMBULATORY_CARE_PROVIDER_SITE_OTHER): Payer: 59

## 2018-09-19 ENCOUNTER — Encounter: Payer: Self-pay | Admitting: Gastroenterology

## 2018-09-19 ENCOUNTER — Ambulatory Visit (INDEPENDENT_AMBULATORY_CARE_PROVIDER_SITE_OTHER): Payer: 59 | Admitting: Gastroenterology

## 2018-09-19 VITALS — BP 142/76 | HR 72 | Ht 67.0 in | Wt 204.0 lb

## 2018-09-19 DIAGNOSIS — R1011 Right upper quadrant pain: Secondary | ICD-10-CM

## 2018-09-19 DIAGNOSIS — K2270 Barrett's esophagus without dysplasia: Secondary | ICD-10-CM

## 2018-09-19 DIAGNOSIS — D649 Anemia, unspecified: Secondary | ICD-10-CM

## 2018-09-19 DIAGNOSIS — Z86010 Personal history of colonic polyps: Secondary | ICD-10-CM | POA: Diagnosis not present

## 2018-09-19 LAB — CBC WITH DIFFERENTIAL/PLATELET
Basophils Absolute: 0.1 10*3/uL (ref 0.0–0.1)
Basophils Relative: 0.9 % (ref 0.0–3.0)
Eosinophils Absolute: 0.3 10*3/uL (ref 0.0–0.7)
Eosinophils Relative: 3.1 % (ref 0.0–5.0)
HCT: 33.8 % — ABNORMAL LOW (ref 36.0–46.0)
HEMOGLOBIN: 11.4 g/dL — AB (ref 12.0–15.0)
Lymphocytes Relative: 32.2 % (ref 12.0–46.0)
Lymphs Abs: 3.1 10*3/uL (ref 0.7–4.0)
MCHC: 33.7 g/dL (ref 30.0–36.0)
MCV: 85.2 fl (ref 78.0–100.0)
MONO ABS: 0.6 10*3/uL (ref 0.1–1.0)
Monocytes Relative: 6.3 % (ref 3.0–12.0)
Neutro Abs: 5.5 10*3/uL (ref 1.4–7.7)
Neutrophils Relative %: 57.5 % (ref 43.0–77.0)
Platelets: 326 10*3/uL (ref 150.0–400.0)
RBC: 3.97 Mil/uL (ref 3.87–5.11)
RDW: 14 % (ref 11.5–15.5)
WBC: 9.6 10*3/uL (ref 4.0–10.5)

## 2018-09-19 LAB — COMPREHENSIVE METABOLIC PANEL
ALBUMIN: 4.7 g/dL (ref 3.5–5.2)
ALK PHOS: 68 U/L (ref 39–117)
ALT: 18 U/L (ref 0–35)
AST: 17 U/L (ref 0–37)
BUN: 27 mg/dL — AB (ref 6–23)
CO2: 22 mEq/L (ref 19–32)
CREATININE: 1.17 mg/dL (ref 0.40–1.20)
Calcium: 9.9 mg/dL (ref 8.4–10.5)
Chloride: 102 mEq/L (ref 96–112)
GFR: 50.97 mL/min — ABNORMAL LOW (ref >60.00)
Glucose, Bld: 166 mg/dL — ABNORMAL HIGH (ref 70–99)
Potassium: 4.4 mEq/L (ref 3.5–5.1)
Sodium: 137 mEq/L (ref 135–145)
TOTAL PROTEIN: 7.9 g/dL (ref 6.0–8.3)
Total Bilirubin: 0.3 mg/dL (ref 0.2–1.2)

## 2018-09-19 LAB — FERRITIN: FERRITIN: 45.5 ng/mL (ref 10.0–291.0)

## 2018-09-19 LAB — IBC PANEL
IRON: 59 ug/dL (ref 42–145)
Saturation Ratios: 12.7 % — ABNORMAL LOW (ref 20.0–50.0)
Transferrin: 331 mg/dL (ref 212.0–360.0)

## 2018-09-19 LAB — FOLATE: FOLATE: 21.8 ng/mL (ref >5.9)

## 2018-09-19 LAB — VITAMIN B12: Vitamin B-12: 217 pg/mL (ref 211–911)

## 2018-09-19 LAB — LIPASE: Lipase: 40 U/L (ref 11.0–59.0)

## 2018-09-19 NOTE — Patient Instructions (Signed)
Your provider has requested that you go to the basement level for lab work before leaving today. Press "B" on the elevator. The lab is located at the first door on the left as you exit the elevator.  You have been scheduled for a CT scan of the abdomen and pelvis at [facility] ([address]---this is in the same building as [facility]).   You are scheduled on 09/29/18 at 4:00pm. You should arrive 15 minutes prior to your appointment time for registration. Please follow the written instructions below on the day of your exam:  WARNING: IF YOU ARE ALLERGIC TO IODINE/X-RAY DYE, PLEASE NOTIFY RADIOLOGY IMMEDIATELY AT [phone]! YOU WILL BE GIVEN A 13 HOUR PREMEDICATION PREP.  1) Do not eat or drink anything after 12:00pm (4 hours prior to your test) 2) You have been given 2 bottles of oral contrast to drink. The solution may taste better if refrigerated, but do NOT add ice or any other liquid to this solution. Shake well before drinking.    Drink 1 bottle of contrast @ 2:00pm (2 hours prior to your exam)  Drink 1 bottle of contrast @ 3:00pm (1 hour prior to your exam)  You may take any medications as prescribed with a small amount of water, if necessary. If you take any of the following medications: METFORMIN, GLUCOPHAGE, GLUCOVANCE, AVANDAMET, RIOMET, FORTAMET, [place] MET, JANUMET, GLUMETZA or METAGLIP, you MAY be asked to HOLD this medication 48 hours AFTER the exam.  The purpose of you drinking the oral contrast is to aid in the visualization of your intestinal tract. The contrast solution may cause some diarrhea. Depending on your individual set of symptoms, you may also receive an intravenous injection of x-ray contrast/dye. Plan on being at [facility] for 30 minutes or longer, depending on the type of exam you are having performed.  This test typically takes 30-45 minutes to complete.  If you have any questions regarding your exam or if you need to  reschedule, you may call the CT department at [phone] between the hours of 8:00 am and 5:00 pm, Monday-Friday.  ________________________________________________________________________  Thank you for choosing me and [facility].  Pricilla Riffle. Dagoberto Ligas., MD., [facility]

## 2018-09-19 NOTE — Progress Notes (Signed)
History of Present Illness: This is a 58 year old female with RUQ pain, R flank, R lower chest pain, R back since November.  She describes her symptoms as constant and unrelated to meals or bowel movements.  Chest CT performed last Friday showed degenerative changes of the thoracic spine, fatty infiltration of the liver and was unremarkable.  She is status post a spinal stimulator implantation in 2017 which she states was inadvertently left on a high level of stimulation in November 2019 and since that time she has had her current symptoms.  She was evaluated by her orthopedic surgeon, Dr. Rolena Infante, in late December 2019 and again yesterday. Denies weight loss, constipation, diarrhea, change in stool caliber, melena, hematochezia, nausea, vomiting, dysphagia, reflux symptoms, chest pain.   Allergies  Allergen Reactions  . Actos [Pioglitazone] Other (See Comments)    bloated   Outpatient Medications Prior to Visit  Medication Sig Dispense Refill  . amLODipine (NORVASC) 2.5 MG tablet Take 2.5 mg by mouth daily.  12  . gemfibrozil (LOPID) 600 MG tablet Take 300 mg by mouth 2 (two) times daily before a meal.     . insulin aspart (NOVOLOG FLEXPEN) 100 UNIT/ML injection Inject 20-31 Units into the skin 3 (three) times daily before meals. Sliding scale as directed     . insulin detemir (LEVEMIR FLEXPEN) 100 UNIT/ML injection Inject 45 Units into the skin 2 (two) times daily.     . metFORMIN (GLUMETZA) 500 MG (MOD) 24 hr tablet Take 1,000 mg by mouth 2 (two) times daily with a meal.    . metoprolol tartrate (LOPRESSOR) 25 MG tablet Take 25 mg by mouth daily.    . Multiple Vitamin (MULTIVITAMIN) tablet Take 1 tablet by mouth daily.    . Na Sulfate-K Sulfate-Mg Sulf (SUPREP BOWEL PREP KIT) 17.5-3.13-1.6 GM/180ML SOLN Take 1 kit by mouth as directed. 354 mL 0  . progesterone (PROMETRIUM) 100 MG capsule Take 100 mg by mouth at bedtime.  12  . propranolol (INDERAL) 10 MG tablet Take 10 mg by mouth 3  (three) times daily.    . RABEprazole (ACIPHEX) 20 MG tablet Take 20 mg by mouth daily.    Marland Kitchen telmisartan-hydrochlorothiazide (MICARDIS HCT) 80-12.5 MG tablet Take 1 tablet by mouth daily.    Marland Kitchen triamcinolone cream (KENALOG) 0.1 % Apply 1 application topically 2 (two) times daily. 30 g 0  . valsartan-hydrochlorothiazide (DIOVAN-HCT) 160-12.5 MG per tablet Take 1 tablet by mouth daily.     Facility-Administered Medications Prior to Visit  Medication Dose Route Frequency Provider Last Rate Last Dose  . 0.9 %  sodium chloride infusion  500 mL Intravenous Continuous Ladene Artist, MD       Past Medical History:  Diagnosis Date  . Deafness in left ear   . Diabetes mellitus without complication ([place])    Type II  . Family history of adverse reaction to anesthesia    mother - PONV  . GERD (gastroesophageal reflux disease)   . Heart palpitations    "with caffine"  . History of bronchitis   . Irregular heart beat   . Numbness and tingling of leg   . PONV (postoperative nausea and vomiting)   . Shortness of breath dyspnea    "with caffiene"   . Sleep apnea    "diagnosed over 10 years ago but I don't wear a CPAP 05/06/16   Past Surgical History:  Procedure Laterality Date  . ABDOMINAL HYSTERECTOMY    . BACK SURGERY  1997, 1999, 2000  . CESAREAN SECTION  1989  . CHOLECYSTECTOMY  1990  . COLONOSCOPY    . ESOPHAGOGASTRODUODENOSCOPY    . INNER EAR SURGERY Left 1983  . PLANTAR FASCIA SURGERY Bilateral   . ROTATOR CUFF REPAIR Bilateral   . SPINAL CORD STIMULATOR INSERTION N/A 05/13/2016   Procedure: LUMBAR SPINAL CORD STIMULATOR INSERTION;  Surgeon: Melina Schools, MD;  Location: [facility];  Service: Orthopedics;  Laterality: N/A;   Social History   Socioeconomic History  . Marital status: Married    Spouse name: Not on file  . Number of children: 1  . Years of education: Not on file  . Highest education level: Not on file  Occupational History  . Not on file  Social Needs  . Financial  resource strain: Not on file  . Food insecurity:    Worry: Not on file    Inability: Not on file  . Transportation needs:    Medical: Not on file    Non-medical: Not on file  Tobacco Use  . Smoking status: Never Smoker  . Smokeless tobacco: Never Used  Substance and Sexual Activity  . Alcohol use: No  . Drug use: No  . Sexual activity: Not on file  Lifestyle  . Physical activity:    Days per week: Not on file    Minutes per session: Not on file  . Stress: Not on file  Relationships  . Social connections:    Talks on phone: Not on file    Gets together: Not on file    Attends religious service: Not on file    Active member of club or organization: Not on file    Attends meetings of clubs or organizations: Not on file    Relationship status: Not on file  Other Topics Concern  . Not on file  Social History Narrative  . Not on file   Family History  Problem Relation Age of Onset  . Diabetes Mother        Carcinomoid tumors all over  . Heart disease Mother   . Diabetes Father       Physical Exam: General: Well developed, well nourished, no acute distress Head: Normocephalic and atraumatic Eyes:  sclerae anicteric, EOMI Ears: Normal auditory acuity Mouth: No deformity or lesions Lungs: Clear throughout to auscultation Heart: Regular rate and rhythm; no murmurs, rubs or bruits Abdomen: Soft, non tender and non distended. No masses, hepatosplenomegaly or hernias noted. Normal Bowel sounds Rectal: Not done  Musculoskeletal: Symmetrical with no gross deformities  Pulses:  Normal pulses noted Extremities: No clubbing, cyanosis, edema or deformities noted Neurological: Alert oriented x 4, grossly nonfocal Psychological:  Alert and cooperative. Normal mood and affect   Assessment and Recommendations:  1. RUQ, R flank, R lower chest pain, R back pain.  No abdominal, flank or chest wall tenderness on exam.  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  CBC, CMP, lipase.  Schedule abd/pelvic CT.   2.  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  Follow standard antireflux measures.  Continue rabeprazole 25 mg po daily.  Surveillance EGD 06/2020.  3.  Personal history of adenomatous colon polyps.  Surveillance colonoscopy in 06/2022.  4.  Fatty infiltration of the liver.  Long-term fat modified, carb modified weight loss diet supervised by her PCP.

## 2018-09-27 ENCOUNTER — Ambulatory Visit (INDEPENDENT_AMBULATORY_CARE_PROVIDER_SITE_OTHER)
Admission: RE | Admit: 2018-09-27 | Discharge: 2018-09-27 | Disposition: A | Discharge status: Home / Self Care | Payer: 59 | Source: Ambulatory Visit | Attending: Gastroenterology | Admitting: Gastroenterology

## 2018-09-27 DIAGNOSIS — R1011 Right upper quadrant pain: Secondary | ICD-10-CM

## 2018-09-27 MED ORDER — IOPAMIDOL (ISOVUE-300) INJECTION 61%
100.0000 mL | Freq: Once | INTRAVENOUS | Status: AC | PRN
  Administered 2018-09-27: 100 mL via INTRAVENOUS

## 2018-09-28 ENCOUNTER — Telehealth: Payer: Self-pay | Admitting: Gastroenterology

## 2018-09-28 NOTE — Telephone Encounter (Signed)
Hi [name], I routed this message to PJ by mistake. Could you please help this pt? Thank you.

## 2018-09-28 NOTE — Telephone Encounter (Signed)
Faxed CT scan to Dr. Jeralyn Ruths office per patient request. Patient notified.

## 2018-09-28 NOTE — Telephone Encounter (Signed)
Pt called requesting to have her ct scan results faxed over to Dr. Nelva Bush ([facility]). Fax is [phone]. Pt has an appt tomorrow at 1:00pm with Dr. Nelva Bush. Pt called before and was transfer to med records, she got upset because nobody advise her of that and nobody answered at med records. Pt is requesting a call when this is taken care of.

## 2018-09-29 ENCOUNTER — Inpatient Hospital Stay: Admission: RE | Admit: 2018-09-29 | Payer: 59 | Source: Ambulatory Visit

## 2018-11-29 ENCOUNTER — Telehealth: Payer: Self-pay | Admitting: Gastroenterology

## 2018-11-29 NOTE — Telephone Encounter (Signed)
Pt called to request labs and Ct scan faxed to her Endocrinologist, fax is: [phone]. Pt stated that she tried to do this through medical records once but it did not work.

## 2018-11-29 NOTE — Telephone Encounter (Signed)
Ct faxed via Epic

## 2019-04-23 ENCOUNTER — Ambulatory Visit: Payer: 59 | Admitting: Orthopedic Surgery

## 2019-04-26 ENCOUNTER — Ambulatory Visit (INDEPENDENT_AMBULATORY_CARE_PROVIDER_SITE_OTHER): Payer: 59 | Admitting: Orthopedic Surgery

## 2019-04-26 ENCOUNTER — Other Ambulatory Visit: Payer: Self-pay

## 2019-04-26 ENCOUNTER — Ambulatory Visit (INDEPENDENT_AMBULATORY_CARE_PROVIDER_SITE_OTHER): Payer: 59

## 2019-04-26 ENCOUNTER — Encounter: Payer: Self-pay | Admitting: Orthopedic Surgery

## 2019-04-26 VITALS — Ht 67.0 in | Wt 204.0 lb

## 2019-04-26 DIAGNOSIS — S92514S Nondisplaced fracture of proximal phalanx of right lesser toe(s), sequela: Secondary | ICD-10-CM | POA: Diagnosis not present

## 2019-04-26 DIAGNOSIS — M79674 Pain in right toe(s): Secondary | ICD-10-CM

## 2019-04-29 ENCOUNTER — Encounter: Payer: Self-pay | Admitting: Orthopedic Surgery

## 2019-04-29 NOTE — Progress Notes (Signed)
Office Visit Note   Patient: Alexis Black           Date of Birth: Oct 01, 1960           MRN: [id number] Visit Date: 04/26/2019              Requested by: Leanna Battles, [address],  [place] [address] PCP: Leanna Battles, MD  Chief Complaint  Patient presents with  . Right Foot - Pain      HPI: Patient is a 58 year old woman is seen in follow-up for a fracture of the base of the proximal phalanx right foot third toe she states she still has continued pain and swelling pain with movement of her foot.  Patient states she was initially treated with a fracture boot for 6 weeks.  Patient states that she does have diabetes and has a spinal cord stimulator.  Assessment & Plan: Visit Diagnoses:  1. Toe pain, right   2. Closed nondisplaced fracture of proximal phalanx of lesser toe of right foot, sequela     Plan: Recommended a stiff soled sneaker do not feel that any surgery would help her.  Recommend against wearing flip-flops.  Follow-Up Instructions: Return if symptoms worsen or fail to improve.   Ortho Exam  Patient is alert, oriented, no adenopathy, well-dressed, normal affect, normal respiratory effort. Examination patient has a palpable pulse she is currently ambulating in flip-flops.  The third toe is tender to palpation there is no redness no cellulitis no swelling no angular deformity no signs of infection.  Imaging: No results found. No images are attached to the encounter.  Labs: Lab Results  Component Value Date   HGBA1C 8.0 (H) 07/01/2011     Lab Results  Component Value Date   ALBUMIN 4.7 09/19/2018   ALBUMIN 4.1 11/19/2012   ALBUMIN 4.0 07/01/2011    No results found for: MG No results found for: VD25OH  No results found for: PREALBUMIN CBC EXTENDED Latest Ref Rng & Units 09/19/2018 04/29/2017 05/06/2016  WBC 4.0 - 10.5 K/uL 9.6 8.3 10.4  RBC 3.87 - 5.11 Mil/uL 3.97 3.94 3.94  HGB 12.0 - 15.0 g/dL 11.4(L) 10.9(L) 11.0(L)  HCT 36.0  - 46.0 % 33.8(L) 33.1(L) 34.0(L)  PLT 150.0 - 400.0 K/uL 326.0 319 323  NEUTROABS 1.4 - 7.7 K/uL 5.5 - -  LYMPHSABS 0.7 - 4.0 K/uL 3.1 - -     Body mass index is 31.95 kg/m.  Orders:  Orders Placed This Encounter  Procedures  . XR Toe 3rd Right   No orders of the defined types were placed in this encounter.    Procedures: No procedures performed  Clinical Data: No additional findings.  ROS:  All other systems negative, except as noted in the HPI. Review of Systems  Objective: Vital Signs: Ht 5\' 7"  (1.702 m)   Wt 204 lb (92.5 kg)   BMI 31.95 kg/m   Specialty Comments:  No specialty comments available.  PMFS History: Patient Active Problem List   Diagnosis Date Noted  . Back pain 05/13/2016  . GERD 09/10/2008  . NAUSEA AND VOMITING 09/10/2008  . DIARRHEA 09/10/2008  . CHANGE IN BOWELS 09/10/2008  . ABDOMINAL PAIN-EPIGASTRIC 09/10/2008  . DIABETES MELLITUS, TYPE II 09/09/2008  . NEPHROPATHY, DIABETIC 09/09/2008   Past Medical History:  Diagnosis Date  . Deafness in left ear   . Diabetes mellitus without complication ([place])    Type II  . Family history of adverse reaction to anesthesia  mother - PONV  . GERD (gastroesophageal reflux disease)   . Heart palpitations    "with caffine"  . History of bronchitis   . Irregular heart beat   . Numbness and tingling of leg   . PONV (postoperative nausea and vomiting)   . Shortness of breath dyspnea    "with caffiene"   . Sleep apnea    "diagnosed over 10 years ago but I don't wear a CPAP 05/06/16    Family History  Problem Relation Age of Onset  . Diabetes Mother        Carcinomoid tumors all over  . Heart disease Mother   . Diabetes Father     Past Surgical History:  Procedure Laterality Date  . ABDOMINAL HYSTERECTOMY    . BACK SURGERY  1997, 1999, 2000  . CESAREAN SECTION  1989  . CHOLECYSTECTOMY  1990  . COLONOSCOPY    . ESOPHAGOGASTRODUODENOSCOPY    . INNER EAR SURGERY Left 1983  . PLANTAR  FASCIA SURGERY Bilateral   . ROTATOR CUFF REPAIR Bilateral   . SPINAL CORD STIMULATOR INSERTION N/A 05/13/2016   Procedure: LUMBAR SPINAL CORD STIMULATOR INSERTION;  Surgeon: Melina Schools, MD;  Location: [facility];  Service: Orthopedics;  Laterality: N/A;   Social History   Occupational History  . Not on file  Tobacco Use  . Smoking status: Never Smoker  . Smokeless tobacco: Never Used  Substance and Sexual Activity  . Alcohol use: No  . Drug use: No  . Sexual activity: Not on file

## 2019-07-16 ENCOUNTER — Encounter: Payer: Self-pay | Admitting: Physician Assistant

## 2019-07-16 ENCOUNTER — Ambulatory Visit: Payer: Self-pay

## 2019-07-16 ENCOUNTER — Ambulatory Visit (INDEPENDENT_AMBULATORY_CARE_PROVIDER_SITE_OTHER): Payer: 59 | Admitting: Physician Assistant

## 2019-07-16 ENCOUNTER — Other Ambulatory Visit: Payer: Self-pay

## 2019-07-16 VITALS — Ht 67.0 in | Wt 204.0 lb

## 2019-07-16 DIAGNOSIS — M79671 Pain in right foot: Secondary | ICD-10-CM | POA: Diagnosis not present

## 2019-07-16 NOTE — Progress Notes (Signed)
Office Visit Note   Patient: Alexis Black           Date of Birth: 15-Sep-1960           MRN: [id number] Visit Date: 07/16/2019              Requested by: Leanna Battles, [address] [place],  [place] [address] PCP: Leanna Battles, MD   Assessment & Plan: Visit Diagnoses:  1. Pain in right foot     Plan: patient will be given a post op shoe. Follow up in 4 weeks.  Follow-Up Instructions: No follow-ups on file.   Orders:  Orders Placed This Encounter  Procedures  . XR Foot 2 Views Right   No orders of the defined types were placed in this encounter.     Procedures: No procedures performed   Clinical Data: No additional findings.   Subjective: Chief Complaint  Patient presents with  . Right Foot - Pain    HPI 2 days s/p hitting front of foot. Pain focused on 3rd and 4th phalynx. Has previous history of 3rd proximal phalynx fracture  Review of Systems   Objective: Vital Signs: Ht 5\' 7"  (1.702 m)   Wt 204 lb (92.5 kg)   BMI 31.95 kg/m   Physical Exam  Ortho Exam Well appearing. Good DP pulse . Toes are warm and pink with BCR. Moderate swelling and echymosis 3rd and 4th toes   Specialty Comments:  No specialty comments available.  Imaging: 2 views right foot were reviewed today. No acute bony abnormalities . Probable old fracture proximal phalynx 3rd . Well maintained alignment   PMFS History: Patient Active Problem List   Diagnosis Date Noted  . Back pain 05/13/2016  . GERD 09/10/2008  . NAUSEA AND VOMITING 09/10/2008  . DIARRHEA 09/10/2008  . CHANGE IN BOWELS 09/10/2008  . ABDOMINAL PAIN-EPIGASTRIC 09/10/2008  . DIABETES MELLITUS, TYPE II 09/09/2008  . NEPHROPATHY, DIABETIC 09/09/2008   Past Medical History:  Diagnosis Date  . Deafness in left ear   . Diabetes mellitus without complication ([place])    Type II  . Family history of adverse reaction to anesthesia    mother - PONV  . GERD (gastroesophageal reflux disease)   . Heart  palpitations    "with caffine"  . History of bronchitis   . Irregular heart beat   . Numbness and tingling of leg   . PONV (postoperative nausea and vomiting)   . Shortness of breath dyspnea    "with caffiene"   . Sleep apnea    "diagnosed over 10 years ago but I don't wear a CPAP 05/06/16    Family History  Problem Relation Age of Onset  . Diabetes Mother        Carcinomoid tumors all over  . Heart disease Mother   . Diabetes Father     Past Surgical History:  Procedure Laterality Date  . ABDOMINAL HYSTERECTOMY    . BACK SURGERY  1997, 1999, 2000  . CESAREAN SECTION  1989  . CHOLECYSTECTOMY  1990  . COLONOSCOPY    . ESOPHAGOGASTRODUODENOSCOPY    . INNER EAR SURGERY Left 1983  . PLANTAR FASCIA SURGERY Bilateral   . ROTATOR CUFF REPAIR Bilateral   . SPINAL CORD STIMULATOR INSERTION N/A 05/13/2016   Procedure: LUMBAR SPINAL CORD STIMULATOR INSERTION;  Surgeon: Melina Schools, MD;  Location: [facility];  Service: Orthopedics;  Laterality: N/A;   Social History   Occupational History  . Not on file  Tobacco Use  . Smoking status: Never Smoker  . Smokeless tobacco: Never Used  Substance and Sexual Activity  . Alcohol use: No  . Drug use: No  . Sexual activity: Not on file

## 2019-08-13 ENCOUNTER — Ambulatory Visit: Payer: 59 | Admitting: Orthopedic Surgery

## 2019-09-06 ENCOUNTER — Ambulatory Visit: Payer: 59 | Admitting: Orthopedic Surgery

## 2019-10-04 ENCOUNTER — Ambulatory Visit: Payer: 59 | Attending: Internal Medicine

## 2019-10-04 DIAGNOSIS — Z20822 Contact with and (suspected) exposure to covid-19: Secondary | ICD-10-CM

## 2019-10-05 LAB — NOVEL CORONAVIRUS, NAA: SARS-CoV-2, NAA: NOT DETECTED

## 2020-01-24 IMAGING — CT CT CHEST W/O CM
2 of 4 series · 15 of 36 positions shown, 18 images · non-contrast
Comparison: 04/29/2017

CLINICAL DATA: Chest pain

EXAM:
CT CHEST WITHOUT CONTRAST
TECHNIQUE: Multidetector CT imaging of the chest was performed following the
standard protocol without IV contrast.

[Series 2: chest 2.00 br40 s3 ax · axial · 0.63mm/px · z∈[+1497,+1721]mm · 12 of 134 slices shown, 15 images]
[im 11/134  mediastinal]
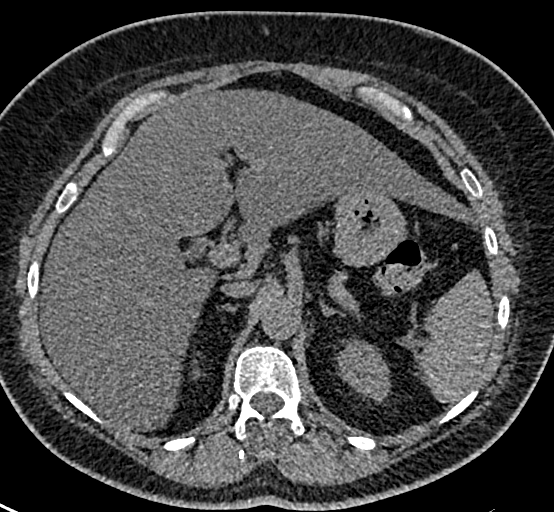
[im 11/134  lung]
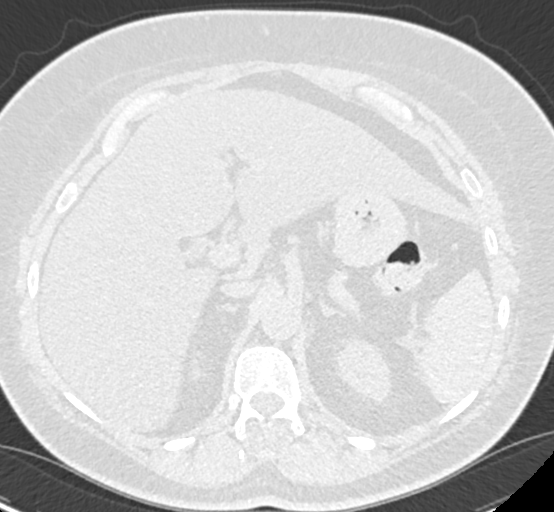
[im 21/134  lung]
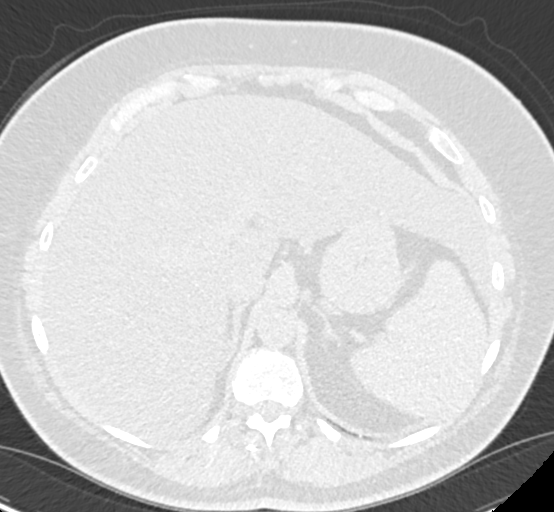
[im 31/134  lung]
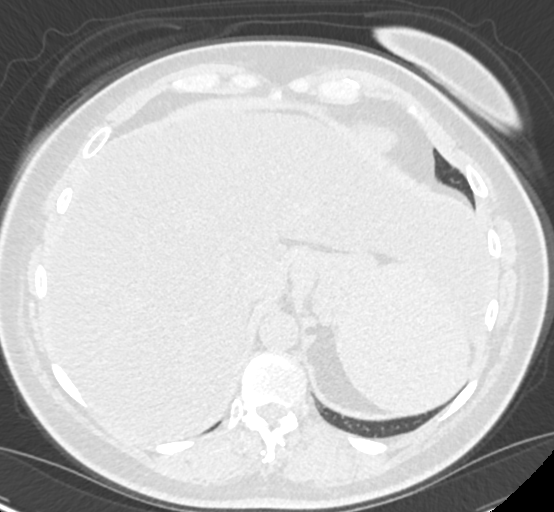
[im 41/134  lung]
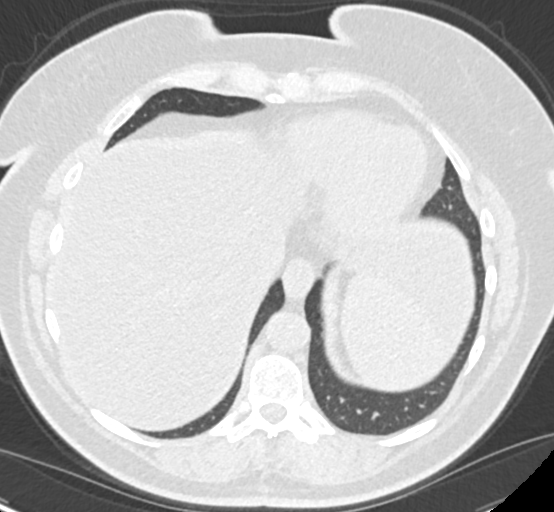
[im 52/134  mediastinal]
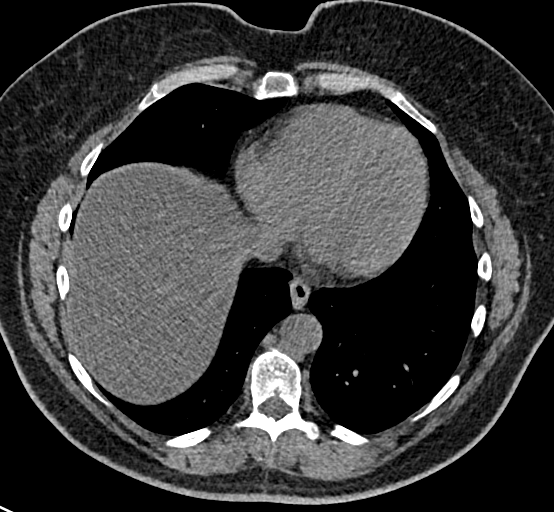
[im 52/134  lung]
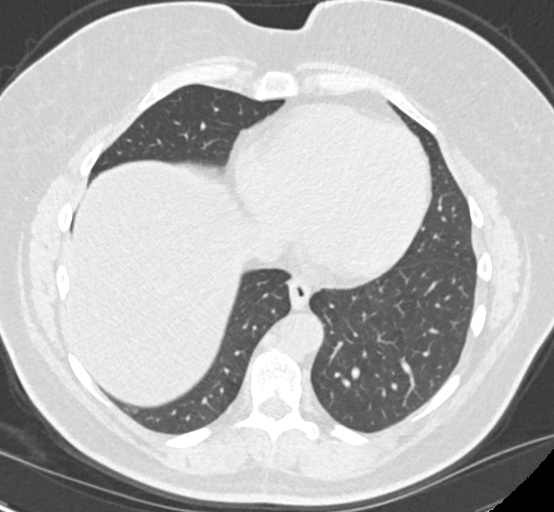
[im 62/134  lung]
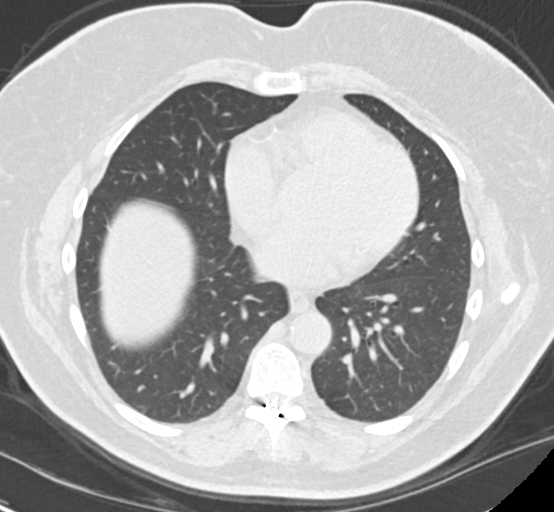
[im 72/134  lung]
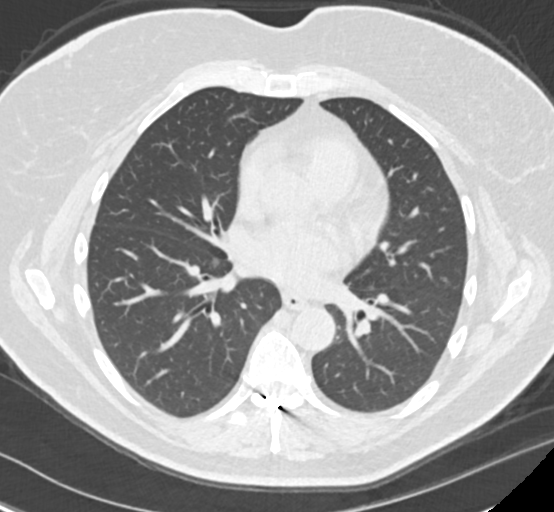
[im 82/134  lung]
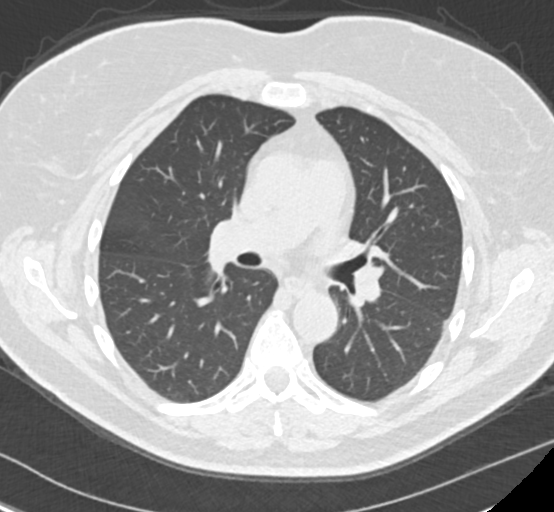
[im 93/134  mediastinal]
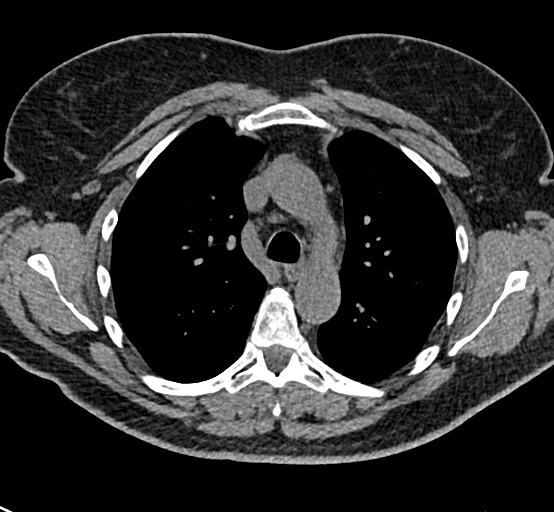
[im 93/134  lung]
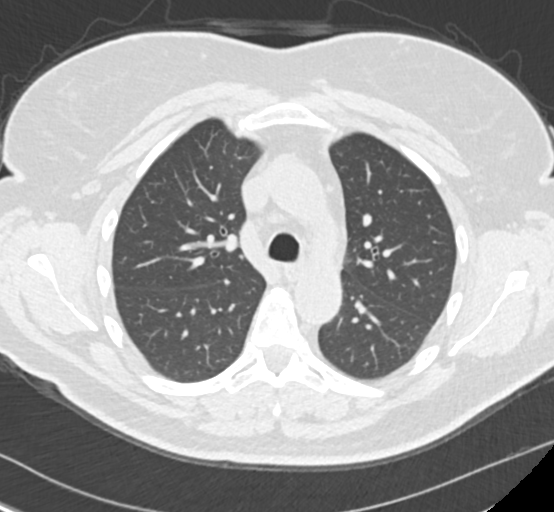
[im 103/134  lung]
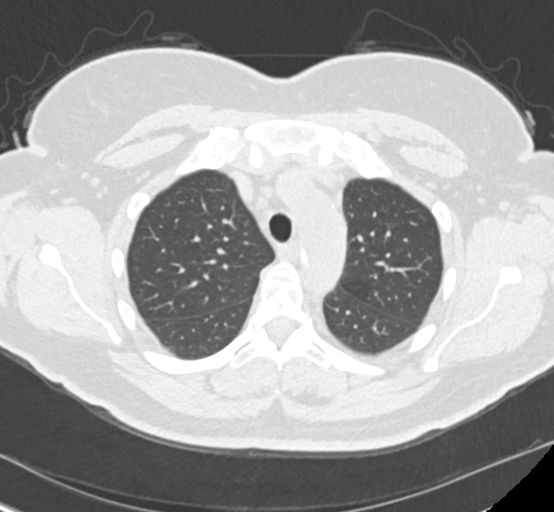
[im 113/134  lung]
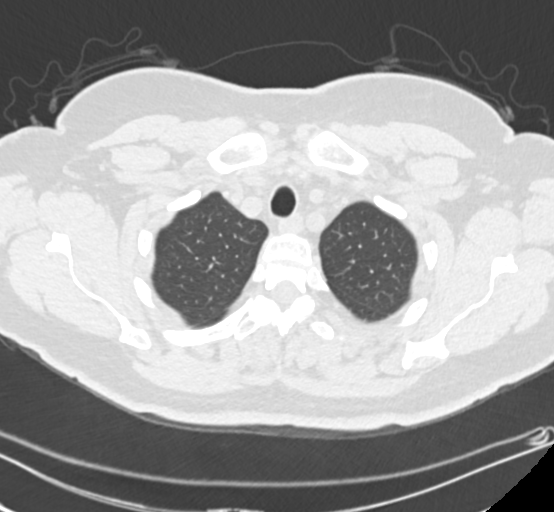
[im 123/134  lung]
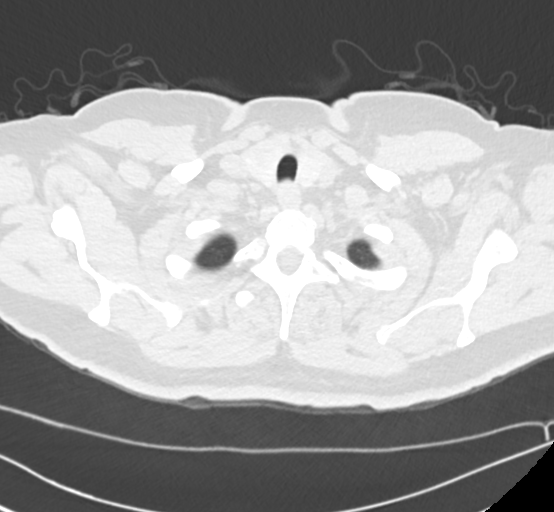

[Series 4: chest 2.00 br40 s3 cor · coronal · 0.53mm/px · 3 of 161 slices shown]
[im 33/161  lung]
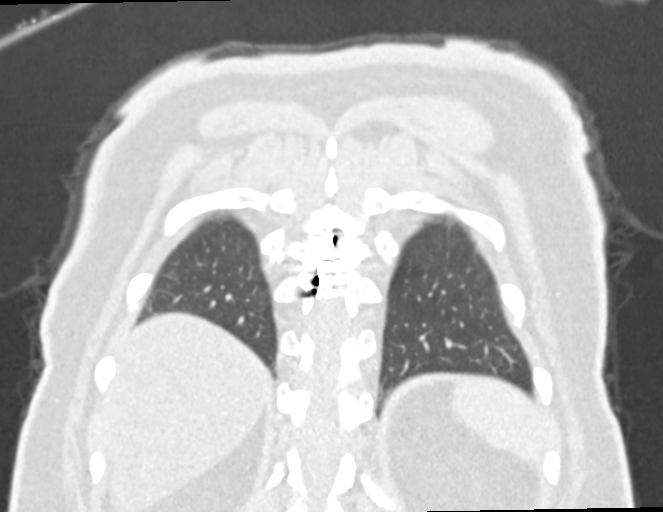
[im 65/161  lung]
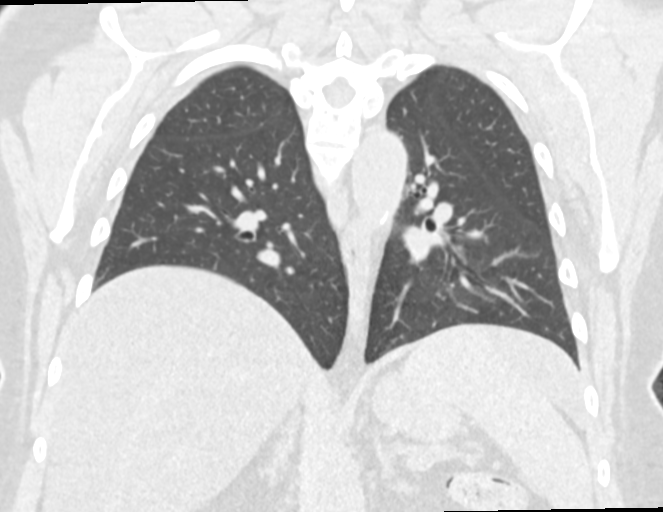
[im 97/161  lung]
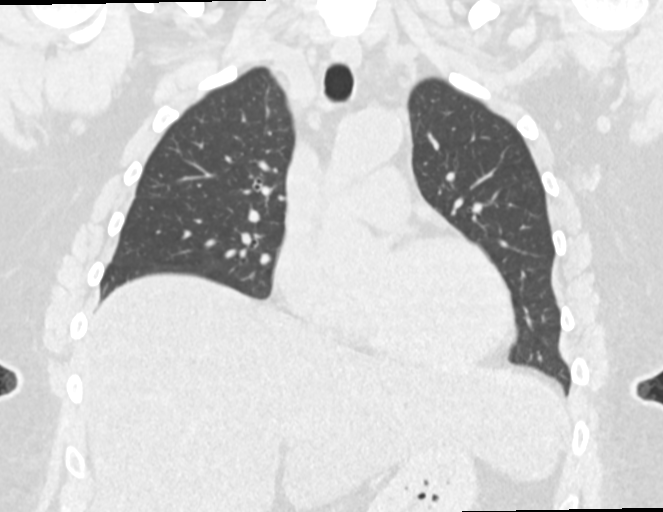

[15 of 36 positions shown; findings below may reference images not displayed]

FINDINGS: Cardiovascular: Thoracic aorta demonstrates atherosclerotic
calcifications without aneurysmal dilatation. No cardiac enlargement
is seen. No coronary calcifications are seen.

Mediastinum/Nodes: Thoracic inlet is within normal limits. No
sizable hilar or mediastinal adenopathy is noted. The esophagus
appears within normal limits.

Lungs/Pleura: Lungs are well aerated bilaterally.

Upper Abdomen: Visualized upper abdomen demonstrates fatty
infiltration of liver.

Musculoskeletal: Mild degenerative changes of the thoracic spine are
noted.
IMPRESSION: Chronic changes without acute abnormality.

These results will be called to the ordering clinician or
representative by the Radiologist Assistant, and communication
documented in the PACS or zVision Dashboard.

## 2020-03-05 ENCOUNTER — Other Ambulatory Visit: Payer: Self-pay

## 2020-03-05 ENCOUNTER — Ambulatory Visit (INDEPENDENT_AMBULATORY_CARE_PROVIDER_SITE_OTHER): Payer: 59

## 2020-03-05 ENCOUNTER — Encounter: Payer: Self-pay | Admitting: Physician Assistant

## 2020-03-05 ENCOUNTER — Ambulatory Visit (INDEPENDENT_AMBULATORY_CARE_PROVIDER_SITE_OTHER): Payer: 59 | Admitting: Physician Assistant

## 2020-03-05 VITALS — Ht 67.0 in | Wt 204.0 lb

## 2020-03-05 DIAGNOSIS — G8929 Other chronic pain: Secondary | ICD-10-CM

## 2020-03-05 DIAGNOSIS — M25562 Pain in left knee: Secondary | ICD-10-CM | POA: Diagnosis not present

## 2020-03-05 MED ORDER — METHYLPREDNISOLONE ACETATE 40 MG/ML IJ SUSP
40.0000 mg | INTRAMUSCULAR | Status: AC | PRN
  Administered 2020-03-05: 40 mg via INTRA_ARTICULAR

## 2020-03-05 MED ORDER — LIDOCAINE HCL 1 % IJ SOLN
5.0000 mL | INTRAMUSCULAR | Status: AC | PRN
  Administered 2020-03-05: 5 mL

## 2020-03-05 NOTE — Progress Notes (Signed)
Office Visit Note   Patient: Alexis Black           Date of Birth: March 08, 1961           MRN: [id number] Visit Date: 03/05/2020              Requested by: Leanna Battles, [address] [place],  [place] [address] PCP: Leanna Battles, MD  Chief Complaint  Patient presents with  . Left Knee - Pain      HPI: This is a pleasant 59 year old woman with a 5-year history of issues with her left knee.  She said in 2016 she was hit in the front of her knee by her dog.  She described it as a hyper extension injury.  She had difficulty bearing weight.  She was treated with immobilization in a brace.  She continued to have difficulties especially around the outside of her knee.  Sometimes she felt like her knee had sharp pain and gave out on her and she had painful popping at that time she did have an injection which provided no relief.  She also had an MRI which demonstrated a lateral femoral condyle bone contusion MCL thickening and a Baker's cyst and coincidentally an accessory third head of the gastrocnemius.  Over the weekend she had increased painful popping on the lateral side of her knee.  She denies any significant swelling.  She started using her patellar stabilization brace again.  She can no longer have an MRI because she has a spinal stimulator in place she is a diabetic  Assessment & Plan: Visit Diagnoses:  1. Chronic pain of left knee     Plan: I have recommended a repeat injection into her knee today.  This would be both diagnostic and hopefully therapeutic.  She will follow-up with Dr. Sharol Given in 3 weeks.  If she still had mechanical symptoms with a painful popping it may be appropriate to do a arthroscopy  Follow-Up Instructions: No follow-ups on file.   Ortho Exam  Patient is alert, oriented, no adenopathy, well-dressed, normal affect, normal respiratory effort. Left knee no effusion.  No apprehension with manipulation of her patella good endpoint on anterior draw she is  focally tender over the lateral joint line.  No effusion or cellulitis she does note after the examination that she has some tingling and numbness.  She has good dorsiflexion and plantarflexion of her ankle  Imaging: No results found. No images are attached to the encounter.  Labs: Lab Results  Component Value Date   HGBA1C 8.0 (H) 07/01/2011     Lab Results  Component Value Date   ALBUMIN 4.7 09/19/2018   ALBUMIN 4.1 11/19/2012   ALBUMIN 4.0 07/01/2011    No results found for: MG No results found for: VD25OH  No results found for: PREALBUMIN CBC EXTENDED Latest Ref Rng & Units 09/19/2018 04/29/2017 05/06/2016  WBC 4.0 - 10.5 K/uL 9.6 8.3 10.4  RBC 3.87 - 5.11 Mil/uL 3.97 3.94 3.94  HGB 12.0 - 15.0 g/dL 11.4(L) 10.9(L) 11.0(L)  HCT 36 - 46 % 33.8(L) 33.1(L) 34.0(L)  PLT 150 - 400 K/uL 326.0 319 323  NEUTROABS 1.4 - 7.7 K/uL 5.5 - -  LYMPHSABS 0.7 - 4.0 K/uL 3.1 - -     Body mass index is 31.95 kg/m.  Orders:  Orders Placed This Encounter  Procedures  . XR Knee 1-2 Views Left   No orders of the defined types were placed in this encounter.    Procedures:  Large Joint Inj on 03/05/2020 3:13 PM Indications: pain and diagnostic evaluation Details: 22 G 1.5 in needle  Arthrogram: No  Medications: 40 mg methylPREDNISolone acetate 40 MG/ML; 5 mL lidocaine 1 % Outcome: tolerated well, no immediate complications Procedure, treatment alternatives, risks and benefits explained, specific risks discussed. Consent was given by the patient.      Clinical Data: No additional findings.  ROS:  All other systems negative, except as noted in the HPI. Review of Systems  Objective: Vital Signs: Ht 5\' 7"  (1.702 m)   Wt 204 lb (92.5 kg)   BMI 31.95 kg/m   Specialty Comments:  No specialty comments available.  PMFS History: Patient Active Problem List   Diagnosis Date Noted  . Back pain 05/13/2016  . GERD 09/10/2008  . NAUSEA AND VOMITING 09/10/2008  . DIARRHEA  09/10/2008  . CHANGE IN BOWELS 09/10/2008  . ABDOMINAL PAIN-EPIGASTRIC 09/10/2008  . DIABETES MELLITUS, TYPE II 09/09/2008  . NEPHROPATHY, DIABETIC 09/09/2008   Past Medical History:  Diagnosis Date  . Deafness in left ear   . Diabetes mellitus without complication ([place])    Type II  . Family history of adverse reaction to anesthesia    mother - PONV  . GERD (gastroesophageal reflux disease)   . Heart palpitations    "with caffine"  . History of bronchitis   . Irregular heart beat   . Numbness and tingling of leg   . PONV (postoperative nausea and vomiting)   . Shortness of breath dyspnea    "with caffiene"   . Sleep apnea    "diagnosed over 10 years ago but I don't wear a CPAP 05/06/16    Family History  Problem Relation Age of Onset  . Diabetes Mother        Carcinomoid tumors all over  . Heart disease Mother   . Diabetes Father     Past Surgical History:  Procedure Laterality Date  . ABDOMINAL HYSTERECTOMY    . BACK SURGERY  1997, 1999, 2000  . CESAREAN SECTION  1989  . CHOLECYSTECTOMY  1990  . COLONOSCOPY    . ESOPHAGOGASTRODUODENOSCOPY    . INNER EAR SURGERY Left 1983  . PLANTAR FASCIA SURGERY Bilateral   . ROTATOR CUFF REPAIR Bilateral   . SPINAL CORD STIMULATOR INSERTION N/A 05/13/2016   Procedure: LUMBAR SPINAL CORD STIMULATOR INSERTION;  Surgeon: Melina Schools, MD;  Location: [facility];  Service: Orthopedics;  Laterality: N/A;   Social History   Occupational History  . Not on file  Tobacco Use  . Smoking status: Never Smoker  . Smokeless tobacco: Never Used  Vaping Use  . Vaping Use: Never used  Substance and Sexual Activity  . Alcohol use: No  . Drug use: No  . Sexual activity: Not on file

## 2020-03-27 ENCOUNTER — Ambulatory Visit: Payer: 59 | Admitting: Orthopedic Surgery

## 2020-04-01 ENCOUNTER — Ambulatory Visit (INDEPENDENT_AMBULATORY_CARE_PROVIDER_SITE_OTHER): Payer: 59 | Admitting: Orthopedic Surgery

## 2020-04-01 ENCOUNTER — Encounter: Payer: Self-pay | Admitting: Orthopedic Surgery

## 2020-04-01 VITALS — Ht 67.0 in | Wt 204.0 lb

## 2020-04-01 DIAGNOSIS — M25562 Pain in left knee: Secondary | ICD-10-CM | POA: Diagnosis not present

## 2020-04-01 DIAGNOSIS — G8929 Other chronic pain: Secondary | ICD-10-CM

## 2020-04-01 NOTE — Progress Notes (Signed)
Office Visit Note   Patient: Alexis Black           Date of Birth: 01-06-1961           MRN: [id number] Visit Date: 04/01/2020              Requested by: Leanna Battles, [address] [place],  [place] [address] PCP: Leanna Battles, MD  Chief Complaint  Patient presents with  . Left Knee - Pain, Follow-up      HPI: Patient is a 59 year old woman who presents with persistent left knee pain she describes this as global pain around removing she states she gets about 1 to 2 weeks of relief after each injection.  She states the knee feels tight and swollen and has had episodes of popping in her knee.  Patient's states she has had 3 spinal surgeries and most recently has had a spinal cord stimulator placed for her sciatic symptoms.  Assessment & Plan: Visit Diagnoses:  1. Chronic pain of left knee     Plan: We will set her up for physical therapy and recommended Voltaren gel.  Do not feel that arthroscopic intervention would be helpful.  Follow-Up Instructions: Return in about 4 weeks (around 04/29/2020).   Ortho Exam  Patient is alert, oriented, no adenopathy, well-dressed, normal affect, normal respiratory effort. Examination patient has a normal gait there is no effusion of the left knee collaterals are cruciates are stable she has global pain to palpation around the patella as well as medial and lateral joint line with no focal tenderness.  There is no redness no cellulitis.  Collaterals and cruciates are stable.  Imaging: No results found. No images are attached to the encounter.  Labs: Lab Results  Component Value Date   HGBA1C 8.0 (H) 07/01/2011     Lab Results  Component Value Date   ALBUMIN 4.7 09/19/2018   ALBUMIN 4.1 11/19/2012   ALBUMIN 4.0 07/01/2011    No results found for: MG No results found for: VD25OH  No results found for: PREALBUMIN CBC EXTENDED Latest Ref Rng & Units 09/19/2018 04/29/2017 05/06/2016  WBC 4.0 - 10.5 K/uL 9.6 8.3 10.4    RBC 3.87 - 5.11 Mil/uL 3.97 3.94 3.94  HGB 12.0 - 15.0 g/dL 11.4(L) 10.9(L) 11.0(L)  HCT 36 - 46 % 33.8(L) 33.1(L) 34.0(L)  PLT 150 - 400 K/uL 326.0 319 323  NEUTROABS 1.4 - 7.7 K/uL 5.5 - -  LYMPHSABS 0.7 - 4.0 K/uL 3.1 - -     Body mass index is 31.95 kg/m.  Orders:  No orders of the defined types were placed in this encounter.  No orders of the defined types were placed in this encounter.    Procedures: No procedures performed  Clinical Data: No additional findings.  ROS:  All other systems negative, except as noted in the HPI. Review of Systems  Objective: Vital Signs: Ht 5\' 7"  (1.702 m)   Wt (!) 204 lb (92.5 kg)   BMI 31.95 kg/m   Specialty Comments:  No specialty comments available.  PMFS History: Patient Active Problem List   Diagnosis Date Noted  . Back pain 05/13/2016  . GERD 09/10/2008  . NAUSEA AND VOMITING 09/10/2008  . DIARRHEA 09/10/2008  . CHANGE IN BOWELS 09/10/2008  . ABDOMINAL PAIN-EPIGASTRIC 09/10/2008  . DIABETES MELLITUS, TYPE II 09/09/2008  . NEPHROPATHY, DIABETIC 09/09/2008   Past Medical History:  Diagnosis Date  . Deafness in left ear   . Diabetes mellitus without complication ([place])  Type II  . Family history of adverse reaction to anesthesia    mother - PONV  . GERD (gastroesophageal reflux disease)   . Heart palpitations    "with caffine"  . History of bronchitis   . Irregular heart beat   . Numbness and tingling of leg   . PONV (postoperative nausea and vomiting)   . Shortness of breath dyspnea    "with caffiene"   . Sleep apnea    "diagnosed over 10 years ago but I don't wear a CPAP 05/06/16    Family History  Problem Relation Age of Onset  . Diabetes Mother        Carcinomoid tumors all over  . Heart disease Mother   . Diabetes Father     Past Surgical History:  Procedure Laterality Date  . ABDOMINAL HYSTERECTOMY    . BACK SURGERY  1997, 1999, 2000  . CESAREAN SECTION  1989  . CHOLECYSTECTOMY  1990  .  COLONOSCOPY    . ESOPHAGOGASTRODUODENOSCOPY    . INNER EAR SURGERY Left 1983  . PLANTAR FASCIA SURGERY Bilateral   . ROTATOR CUFF REPAIR Bilateral   . SPINAL CORD STIMULATOR INSERTION N/A 05/13/2016   Procedure: LUMBAR SPINAL CORD STIMULATOR INSERTION;  Surgeon: Melina Schools, MD;  Location: [facility];  Service: Orthopedics;  Laterality: N/A;   Social History   Occupational History  . Not on file  Tobacco Use  . Smoking status: Never Smoker  . Smokeless tobacco: Never Used  Vaping Use  . Vaping Use: Never used  Substance and Sexual Activity  . Alcohol use: No  . Drug use: No  . Sexual activity: Not on file

## 2020-04-29 ENCOUNTER — Ambulatory Visit: Payer: 59 | Admitting: Orthopedic Surgery

## 2020-10-01 ENCOUNTER — Encounter: Payer: Self-pay | Admitting: Gastroenterology

## 2020-10-07 HISTORY — PX: ESOPHAGOGASTRODUODENOSCOPY: SHX1529

## 2020-10-21 ENCOUNTER — Other Ambulatory Visit: Payer: Self-pay

## 2020-10-21 ENCOUNTER — Ambulatory Visit (AMBULATORY_SURGERY_CENTER): Payer: Self-pay

## 2020-10-21 VITALS — Ht 67.0 in | Wt 185.0 lb

## 2020-10-21 DIAGNOSIS — K2270 Barrett's esophagus without dysplasia: Secondary | ICD-10-CM

## 2020-10-21 NOTE — Progress Notes (Signed)
No allergies to soy or egg Pt is not on blood thinners or diet pills Denies issues with sedation/intubation Denies atrial flutter/fib Denies constipation   Emmi instructions given to pt  Pt is aware of Covid safety and care partner requirements.  

## 2020-10-30 ENCOUNTER — Other Ambulatory Visit: Payer: Self-pay | Admitting: Gastroenterology

## 2020-10-31 LAB — SARS CORONAVIRUS 2 (TAT 6-24 HRS): SARS Coronavirus 2: NEGATIVE

## 2020-11-04 ENCOUNTER — Ambulatory Visit (AMBULATORY_SURGERY_CENTER): Payer: 59 | Admitting: Gastroenterology

## 2020-11-04 ENCOUNTER — Encounter: Payer: Self-pay | Admitting: Gastroenterology

## 2020-11-04 ENCOUNTER — Other Ambulatory Visit: Payer: Self-pay

## 2020-11-04 VITALS — BP 125/69 | HR 69 | Temp 97.1°F | Resp 17 | Ht 67.0 in | Wt 185.0 lb

## 2020-11-04 DIAGNOSIS — K449 Diaphragmatic hernia without obstruction or gangrene: Secondary | ICD-10-CM

## 2020-11-04 DIAGNOSIS — K317 Polyp of stomach and duodenum: Secondary | ICD-10-CM

## 2020-11-04 DIAGNOSIS — K2270 Barrett's esophagus without dysplasia: Secondary | ICD-10-CM | POA: Diagnosis not present

## 2020-11-04 MED ORDER — SODIUM CHLORIDE 0.9 % IV SOLN
500.0000 mL | Freq: Once | INTRAVENOUS | Status: DC
Start: 2020-11-04 — End: 2020-11-04

## 2020-11-04 NOTE — Progress Notes (Signed)
Pt's states no medical or surgical changes since previsit or office visit.  ° °Cw vitals  °

## 2020-11-04 NOTE — Progress Notes (Signed)
Pt c/o right lower jaw "discomfort", rating as a "3."  No difficulty moving jaw or trouble swallowing.  I told her to call back if discomfort increases.

## 2020-11-04 NOTE — Progress Notes (Signed)
pt tolerated well. VSS. awake and to recovery. Report given to RN. Bite block left in to recovery. No trauma noted.

## 2020-11-04 NOTE — Patient Instructions (Signed)
Await pathology  Next upper endoscopy- 3 years  Please handout about hiatal hernias and Barretts esophagus  Continue your normal medications  YOU HAD AN ENDOSCOPIC PROCEDURE TODAY AT [facility]:   Refer to the procedure report that was given to you for any specific questions about what was found during the examination.  If the procedure report does not answer your questions, please call your gastroenterologist to clarify.  If you requested that your care partner not be given the details of your procedure findings, then the procedure report has been included in a sealed envelope for you to review at your convenience later.  YOU SHOULD EXPECT: Some feelings of bloating in the abdomen. Passage of more gas than usual.  Walking can help get rid of the air that was put into your GI tract during the procedure and reduce the bloating.  Please Note:  You might notice some irritation and congestion in your nose or some drainage.  This is from the oxygen used during your procedure.  There is no need for concern and it should clear up in a day or so.  SYMPTOMS TO REPORT IMMEDIATELY:   Following upper endoscopy (EGD)  Vomiting of blood or coffee ground material  New chest pain or pain under the shoulder blades  Painful or persistently difficult swallowing  New shortness of breath  Fever of 100F or higher  Black, tarry-looking stools  For urgent or emergent issues, a gastroenterologist can be reached at any hour by calling [phone]. Do not use MyChart messaging for urgent concerns.    DIET:  We do recommend a small meal at first, but then you may proceed to your regular diet.  Drink plenty of fluids but you should avoid alcoholic beverages for 24 hours.  ACTIVITY:  You should plan to take it easy for the rest of today and you should NOT DRIVE or use heavy machinery until tomorrow (because of the sedation medicines used during the test).    FOLLOW UP: Our staff will call  the number listed on your records 48-72 hours following your procedure to check on you and address any questions or concerns that you may have regarding the information given to you following your procedure. If we do not reach you, we will leave a message.  We will attempt to reach you two times.  During this call, we will ask if you have developed any symptoms of COVID 19. If you develop any symptoms (ie: fever, flu-like symptoms, shortness of breath, cough etc.) before then, please call [phone].  If you test positive for Covid 19 in the 2 weeks post procedure, please call and report this information to [place].    If any biopsies were taken you will be contacted by phone or by letter within the next 1-3 weeks.  Please call us at [phone] if you have not heard about the biopsies in 3 weeks.    SIGNATURES/CONFIDENTIALITY: You and/or your care partner have signed paperwork which will be entered into your electronic medical record.  These signatures attest to the fact that that the information above on your After Visit Summary has been reviewed and is understood.  Full responsibility of the confidentiality of this discharge information lies with you and/or your care-partner.

## 2020-11-04 NOTE — Progress Notes (Signed)
Called to room to assist during endoscopic procedure.  Patient ID and intended procedure confirmed with present staff. Received instructions for my participation in the procedure from the performing physician.  

## 2020-11-04 NOTE — Op Note (Signed)
[facility] Patient Name: Alexis Black Procedure Date: 11/04/2020 10:21 AM MRN: [id number] Endoscopist: Ladene Artist , MD Age: 60 Referring MD:  Date of Birth: [birth year] Gender: Female Account #: [id number] Procedure:                Upper GI endoscopy Indications:              Surveillance for malignancy due to personal history                            of Barrett's esophagus Medicines:                Monitored Anesthesia Care Procedure:                Pre-Anesthesia Assessment:                           - Prior to the procedure, a History and Physical                            was performed, and patient medications and                            allergies were reviewed. The patient's tolerance of                            previous anesthesia was also reviewed. The risks                            and benefits of the procedure and the sedation                            options and risks were discussed with the patient.                            All questions were answered, and informed consent                            was obtained. Prior Anticoagulants: The patient has                            taken no previous anticoagulant or antiplatelet                            agents. ASA Grade Assessment: II - A patient with                            mild systemic disease. After reviewing the risks                            and benefits, the patient was deemed in                            satisfactory condition to undergo the procedure.  After obtaining informed consent, the endoscope was                            passed under direct vision. Throughout the                            procedure, the patient's blood pressure, pulse, and                            oxygen saturations were monitored continuously. The                            Endoscope was introduced through the mouth, and                            advanced to the second part of  duodenum. The upper                            GI endoscopy was accomplished without difficulty.                            The patient tolerated the procedure well. Scope In: Scope Out: Findings:                 There were esophageal mucosal changes secondary to                            established short-segment Barrett's disease present                            in the distal esophagus. The maximum longitudinal                            extent of these mucosal changes was 1 cm in length.                            Mucosa was biopsied with a cold forceps for                            histology in a targeted manner at intervals of 0.5                            cm in the lower third of the esophagus. One                            specimen bottle was sent to pathology.                           The exam of the esophagus was otherwise normal.                           A small hiatal hernia was present.  A few 3 to 5 mm sessile polyps with no bleeding and                            no stigmata of recent bleeding were found in the                            gastric body. Biopsies were taken with a cold                            forceps for histology.                           The exam of the stomach was otherwise normal.                           The duodenal bulb and second portion of the                            duodenum were normal. Complications:            No immediate complications. Estimated Blood Loss:     Estimated blood loss was minimal. Impression:               - Esophageal mucosal changes secondary to                            established short-segment Barrett's disease.                            Biopsied.                           - Small hiatal hernia.                           - A few gastric polyps. Biopsied.                           - Normal duodenal bulb and second portion of the                            duodenum. Recommendation:            - Patient has a contact number available for                            emergencies. The signs and symptoms of potential                            delayed complications were discussed with the                            patient. Return to normal activities tomorrow.                            Written discharge instructions were provided to the  patient.                           - Resume previous diet.                           - Continue present medications.                           - Await pathology results.                           - Repeat upper endoscopy in 3 years for                            surveillance of Barrett's esophagus. Ladene Artist, MD 11/04/2020 10:50:33 AM This report has been signed electronically.

## 2020-11-06 ENCOUNTER — Telehealth: Payer: Self-pay

## 2020-11-06 NOTE — Telephone Encounter (Signed)
°  Follow up Call-  Call back number 11/04/2020  Post procedure Call Back phone  # [phone]  Permission to leave phone message Yes  Some recent data might be hidden     Patient questions:  Do you have a fever, pain , or abdominal swelling? No. Pain Score  0 *  Have you tolerated food without any problems? Yes.    Have you been able to return to your normal activities? Yes.    Do you have any questions about your discharge instructions: Diet   No. Medications  No. Follow up visit  No.  Do you have questions or concerns about your Care? No.  Actions: * If pain score is 4 or above: No action needed, pain <4. Patient c/o some mild jaw soreness after procedure. Patient states she has not taken anything and will try tylenol. Advised to call back if no improvement or any concerns.

## 2020-11-12 ENCOUNTER — Encounter: Payer: Self-pay | Admitting: Gastroenterology

## 2021-02-04 DIAGNOSIS — Z8616 Personal history of COVID-19: Secondary | ICD-10-CM

## 2021-02-04 HISTORY — DX: Personal history of COVID-19: Z86.16

## 2021-04-23 ENCOUNTER — Encounter: Payer: Self-pay | Admitting: Orthopedic Surgery

## 2021-04-23 ENCOUNTER — Other Ambulatory Visit: Payer: Self-pay

## 2021-04-23 ENCOUNTER — Ambulatory Visit (INDEPENDENT_AMBULATORY_CARE_PROVIDER_SITE_OTHER): Payer: 59

## 2021-04-23 ENCOUNTER — Ambulatory Visit (INDEPENDENT_AMBULATORY_CARE_PROVIDER_SITE_OTHER): Payer: 59 | Admitting: Physician Assistant

## 2021-04-23 DIAGNOSIS — G8929 Other chronic pain: Secondary | ICD-10-CM | POA: Diagnosis not present

## 2021-04-23 DIAGNOSIS — M25562 Pain in left knee: Secondary | ICD-10-CM

## 2021-04-23 MED ORDER — MELOXICAM 15 MG PO TABS: 15.0000 mg | ORAL_TABLET | Freq: Every day | ORAL | 0 refills | Status: DC

## 2021-04-23 NOTE — Progress Notes (Signed)
Office Visit Note   Patient: Alexis Black           Date of Birth: 05-May-1961           MRN: [id number] Visit Date: 04/23/2021              Requested by: Leanna Battles, [place],  [place] [address] PCP: Leanna Battles, MD  Chief Complaint  Patient presents with   Left Knee - Pain      HPI: Patient is in follow-up for her left knee.  She has a history of left knee pain was last seen by [place] a year ago.  She cannot have an MRI because she has a nerve stimulator in her back.  She was recommended to do physical therapy to help with her patellofemoral symptoms.  She said she just could not get into see anybody so did not do this.  She has tried over-the-counter anti-inflammatories but she says they make her sleepy.  She has not had relief with injections or topical Voltaren gel  Assessment & Plan: Visit Diagnoses:  1. Chronic pain of left knee     Plan: I think she really needs to try and get into physical therapy for strengthening especially of her quadricep.  I did demonstrate an exercise she could do today but I think she needs to work with physical therapy she will try a course of Mobic and see how that helps her.  Follow-up in 6 weeks.  Follow-Up Instructions: No follow-ups on file.   Ortho Exam  Patient is alert, oriented, no adenopathy, well-dressed, normal affect, normal respiratory effort. Examination of her left knee no effusion no swelling she has global tenderness around the patella mild tenderness over the medial joint line she also has some patella Day good stability with varus valgus and anterior drawer  Imaging: No results found. No images are attached to the encounter.  Labs: Lab Results  Component Value Date   HGBA1C 8.0 (H) 07/01/2011     Lab Results  Component Value Date   ALBUMIN 4.7 09/19/2018   ALBUMIN 4.1 11/19/2012   ALBUMIN 4.0 07/01/2011    No results found for: MG No results found for: VD25OH  No results found for:  PREALBUMIN CBC EXTENDED Latest Ref Rng & Units 09/19/2018 04/29/2017 05/06/2016  WBC 4.0 - 10.5 K/uL 9.6 8.3 10.4  RBC 3.87 - 5.11 Mil/uL 3.97 3.94 3.94  HGB 12.0 - 15.0 g/dL 11.4(L) 10.9(L) 11.0(L)  HCT 36.0 - 46.0 % 33.8(L) 33.1(L) 34.0(L)  PLT 150.0 - 400.0 K/uL 326.0 319 323  NEUTROABS 1.4 - 7.7 K/uL 5.5 - -  LYMPHSABS 0.7 - 4.0 K/uL 3.1 - -     There is no height or weight on file to calculate BMI.  Orders:  Orders Placed This Encounter  Procedures   XR Knee 1-2 Views Left   Meds ordered this encounter  Medications   meloxicam (MOBIC) 15 MG tablet    Sig: Take 1 tablet (15 mg total) by mouth daily.    Dispense:  30 tablet    Refill:  0     Procedures: No procedures performed  Clinical Data: No additional findings.  ROS:  All other systems negative, except as noted in the HPI. Review of Systems  Objective: Vital Signs: There were no vitals taken for this visit.  Specialty Comments:  No specialty comments available.  PMFS History: Patient Active Problem List   Diagnosis Date Noted   Back pain 05/13/2016  GERD 09/10/2008   NAUSEA AND VOMITING 09/10/2008   DIARRHEA 09/10/2008   CHANGE IN BOWELS 09/10/2008   ABDOMINAL PAIN-EPIGASTRIC 09/10/2008   DIABETES MELLITUS, TYPE II 09/09/2008   NEPHROPATHY, DIABETIC 09/09/2008   Past Medical History:  Diagnosis Date   Deafness in left ear    Diabetes mellitus without complication ([facility])    Type II   Family history of adverse reaction to anesthesia    mother - PONV   GERD (gastroesophageal reflux disease)    Heart palpitations    "with caffine"   History of bronchitis    Irregular heart beat    Numbness and tingling of leg    PONV (postoperative nausea and vomiting)    Shortness of breath dyspnea    "with caffiene"    Sleep apnea    "diagnosed over 10 years ago but I don't wear a CPAP 05/06/16    Family History  Problem Relation Age of Onset   Diabetes Mother        Carcinomoid tumors all over   Heart  disease Mother    Diabetes Father    Colon cancer Neg Hx    Colon polyps Neg Hx    Esophageal cancer Neg Hx    Rectal cancer Neg Hx    Stomach cancer Neg Hx     Past Surgical History:  Procedure Laterality Date   ABDOMINAL HYSTERECTOMY     BACK SURGERY  1997, 1999, 2000   [facility]   COLONOSCOPY  2018   ESOPHAGOGASTRODUODENOSCOPY     INNER EAR SURGERY Left 1983   PLANTAR FASCIA SURGERY Bilateral    ROTATOR CUFF REPAIR Bilateral    SPINAL CORD STIMULATOR INSERTION N/A 05/13/2016   Procedure: LUMBAR SPINAL CORD STIMULATOR INSERTION;  Surgeon: Melina Schools, MD;  Location: [facility];  Service: Orthopedics;  Laterality: N/A;   UPPER GASTROINTESTINAL ENDOSCOPY  2018   Social History   Occupational History   Not on file  Tobacco Use   Smoking status: Never   Smokeless tobacco: Never  Vaping Use   Vaping Use: Never used  Substance and Sexual Activity   Alcohol use: No   Drug use: No   Sexual activity: Not on file

## 2021-06-04 ENCOUNTER — Ambulatory Visit: Payer: 59 | Admitting: Orthopedic Surgery

## 2021-07-15 DIAGNOSIS — M654 Radial styloid tenosynovitis [de Quervain]: Secondary | ICD-10-CM | POA: Insufficient documentation

## 2021-09-11 DIAGNOSIS — M654 Radial styloid tenosynovitis [de Quervain]: Secondary | ICD-10-CM | POA: Diagnosis not present

## 2021-09-11 DIAGNOSIS — G5601 Carpal tunnel syndrome, right upper limb: Secondary | ICD-10-CM | POA: Insufficient documentation

## 2021-09-11 DIAGNOSIS — G5603 Carpal tunnel syndrome, bilateral upper limbs: Secondary | ICD-10-CM | POA: Diagnosis not present

## 2021-09-14 DIAGNOSIS — N644 Mastodynia: Secondary | ICD-10-CM | POA: Diagnosis not present

## 2021-09-16 ENCOUNTER — Encounter (HOSPITAL_BASED_OUTPATIENT_CLINIC_OR_DEPARTMENT_OTHER): Payer: Self-pay | Admitting: Orthopedic Surgery

## 2021-09-16 ENCOUNTER — Other Ambulatory Visit: Payer: Self-pay

## 2021-09-16 DIAGNOSIS — Z974 Presence of external hearing-aid: Secondary | ICD-10-CM

## 2021-09-16 HISTORY — DX: Presence of external hearing-aid: Z97.4

## 2021-09-16 NOTE — Progress Notes (Addendum)
Spoke w/ via phone for pre-op interview---pt Lab needs dos----    I stat, ekg           Lab results------lov endocrinology dr Lind Guest 06-04-2021 epic COVID test -----patient states asymptomatic no test needed Arrive at -------630 am 09-21-2021 NPO after MN NO Solid Food.  Clear liquids from MN until---530 am Med rec completed Medications to take morning of surgery -----Amlodipine, Aciphex Diabetic medication -----none day of surgery Patient instructed no nail polish to be worn day of surgery Patient instructed to bring photo id and insurance card day of surgery Patient aware to have Driver (ride ) / caregiver    for 24 hours after surgery  driver roger husband Patient Special Instructions -----bring spinal cord stimulator remote day of surgery Pre-Op special Istructions ----orders req dr Greta Doom epic ib Patient verbalized understanding of instructions that were given at this phone interview. Patient denies shortness of breath, chest pain, fever, cough at this phone interview.   Pt has spinal cord stimulator, will bring remote Pt has free style libre 2 glucose monitor on right lower abdomen

## 2021-09-19 NOTE — H&P (Signed)
Preoperative History & Physical Exam  Surgeon: Matt Holmes, MD  Diagnosis: Right Carpal Tunnel Syndrome, Right dequervains syndrome  Planned Procedure: Procedure(s) (LRB): Right Carpal Tunnel Release + Right Dequervains first dorsal compartment release (Right)  History of Present Illness:   Patient is a 61 y.o. female with symptoms consistent with Right Carpal Tunnel Syndrome, Right dequervains syndrome who presents for surgical intervention. The risks, benefits and alternatives of surgical intervention were discussed and informed consent was obtained prior to surgery.  Past Medical History:  Past Medical History:  Diagnosis Date   Deafness in left ear    DM Type 2    Family history of adverse reaction to anesthesia    mother - PONV   GERD (gastroesophageal reflux disease)    Heart palpitations    "with caffeine" occ has   History of bronchitis    last time 02-2021   History of COVID-19 02/2021   bronchitis symptoms x 4 to 5 days all symptoms resolved   History of hiatal hernia    Numbness and tingling of leg    burning both feet @ times   PONV (postoperative nausea and vomiting)    S/P insertion of spinal cord stimulator    Shortness of breath dyspnea    "with caffiene"    Sleep apnea    "diagnosed over 10 years ago but I don't wear a CPAP per pt on 09-16-2021   Wears glasses    Wears hearing aid in left ear 09/16/2021   sometimes wears  per pt    Past Surgical History:  Past Surgical History:  Procedure Laterality Date   ABDOMINAL HYSTERECTOMY     with right ovary removed over 30 yrs ago per pt on 09-16-2021   [place], 1999, 2000 lower   [place]   COLONOSCOPY  2018   polyp removal fu 5 years   ESOPHAGOGASTRODUODENOSCOPY  10/2020   INNER EAR SURGERY Left 1983   left salpingo-oophorectomy     yrs ago after hysterectomy per pt on 09-28-2021   PLANTAR FASCIA SURGERY Bilateral    over 15 yrs ago per pt on  09-17-2021   ROTATOR CUFF REPAIR Bilateral    over 10 yrs ago   [place] N/A 05/13/2016   Procedure: LUMBAR SPINAL CORD STIMULATOR INSERTION;  Surgeon: Melina Schools, MD;  Location: [facility];  Service: Orthopedics;  Laterality: N/A;   UPPER GASTROINTESTINAL ENDOSCOPY  2018    Medications:  Prior to Admission medications   Medication Sig Start Date End Date Taking? Authorizing Provider  Continuous Blood Gluc Sensor (FREESTYLE LIBRE 2 SENSOR) MISC by Does not apply route.   Yes [provider]  escitalopram (LEXAPRO) 5 MG tablet Take 5 mg by mouth as needed.   Yes [provider]  VITAMIN D PO Take by mouth as needed.   Yes [provider]  amLODipine (NORVASC) 2.5 MG tablet Take 2.5 mg by mouth daily. 03/27/18   [provider]  Coenzyme Q10 50 MG CAPS Take by mouth daily.    [provider]  Estradiol 10 MCG TABS vaginal tablet as needed. 02/24/12   [provider]  fenofibrate 54 MG tablet Take 1 tablet by mouth daily. Patient not taking: Reported on 09/16/2021 02/18/20   [provider]  gabapentin (NEURONTIN) 300 MG capsule 300 mg at bedtime. 06/13/20   [provider]  metFORMIN (GLUMETZA) 500 MG (MOD) 24  hr tablet Take 1,000 mg by mouth 2 (two) times daily with a meal.    [provider]  methylphenidate (METADATE CD) 40 MG CR capsule 1 capsule before breakfast in the morning 08/13/20   [provider]  metoprolol tartrate (LOPRESSOR) 25 MG tablet Take 25 mg by mouth at bedtime.    [provider]  Multiple Vitamin (MULTIVITAMIN) tablet Take 1 tablet by mouth daily.    [provider]  Omega-3 1000 MG CAPS Take by mouth daily.    [provider]  progesterone (PROMETRIUM) 100 MG capsule Take 100 mg by mouth at bedtime. 04/21/17   [provider]  RABEprazole (ACIPHEX) 20 MG tablet Take 20 mg by mouth daily.    [provider]   Semaglutide,0.25 or 0.5MG /DOS, (OZEMPIC, 0.25 OR 0.5 MG/DOSE,) 2 MG/1.5ML [facility] tuesday    [provider]  telmisartan-hydrochlorothiazide (MICARDIS HCT) 80-12.5 MG tablet Take 1 tablet by mouth daily.    [provider]  valACYclovir (VALTREX) 1000 MG tablet as needed. For fever blisters 04/18/20   [provider]    Allergies:  Actos [pioglitazone], Dulaglutide, Pravachol [pravastatin], and Quinapril hcl  Review of Systems: Negative except per HPI.  Physical Exam: Alert and oriented, NAD Head and neck: no masses, normal alignment CV: pulse intact Pulm: no increased work of breathing, respirations even and unlabored Abdomen: non-distended Extremities: extremities warm and well perfused  LABS: No results found for this or any previous visit (from the past 2160 hour(s)).   Complete History and Physical exam available in the office notes  Alexis Black

## 2021-09-19 NOTE — Discharge Instructions (Addendum)
  Orthopaedic Hand Surgery Discharge Instructions  WEIGHT BEARING STATUS: Non weight bearing on operative extremity  DRESSING CARE: Please keep your dressing/splint/cast clean and dry until your follow-up appointment. You may shower by placing a waterproof covering over your dressing/splint/cast. Contact your surgeon if your splint/cast gets wet. It will need to be changed to prevent skin breakdown.  PAIN CONTROL: First line medications for post operative pain control are Tylenol (acetaminophen) and Motrin (ibuprofen) if you are able to take these medications. If you have been prescribed a medication these can be taken as breakthrough pain medications. Please note that some narcotic pain medication has acetaminophen added and you should never consume more than 4,000mg of acetaminophen in 24-hour period. Please note that if you are given Toradol (ketorolac) you should not take similar medications such as ibuprofen or naproxen.  DISCHARGE MEDICATIONS: If you have been prescribed medication it was sent electronically to your pharmacy. No changes have been made to your home medications.  ICE/ELEVATION: Ice and elevate your injured extremity as needed. Avoid direct contact of ice with skin.   BANDAGE FEELS TOO TIGHT: If your bandage feels too tight, first make sure you are elevating your fingers as much as possible. The outer layer of the bandage can be unwrapped and reapplied more loosely. If no improvement, you may carefully cut the inner layer longitudinally until the pressure has resolved and then rewrap the outer layer. If you are not comfortable with these instructions, please call the office and the bandage can be changed for you.   FOLLOW UP: You will be called after surgery with an appointment date and time, however if you have not received a phone call within 3 days, please call during regular office hours at [phone] to schedule a post operative appointment.  Please Seek Medical Attention  if: Call MD for: pain or pressure in chest, jaw, arm, back, neck  Call MD for: temperature greater than 101 F for more than 24 hrs Call MD for: difficulty breathing Call MD for: incision redness, bleeding, drainage  Call MD for: palpitations or feeling that the heart is racing  Call MD for: increased swelling in arm, leg, ankle, or abdomen  Call MD for: lightheadedness, dizziness, fainting Call 911 or go to ER for any medical emergency if you are not able to get in touch with your doctor   J. Reid Spears, MD Orthopaedic Hand Surgeon [facility] Office number: [phone] [address] , [place] [address]  Post Anesthesia Home Care Instructions  Activity: Get plenty of rest for the remainder of the day. A responsible individual must stay with you for 24 hours following the procedure.  For the next 24 hours, DO NOT: -Drive a car -Operate machinery -Drink alcoholic beverages -Take any medication unless instructed by your physician -Make any legal decisions or sign important papers.  Meals: Start with liquid foods such as gelatin or soup. Progress to regular foods as tolerated. Avoid greasy, spicy, heavy foods. If nausea and/or vomiting occur, drink only clear liquids until the nausea and/or vomiting subsides. Call your physician if vomiting continues.  Special Instructions/Symptoms: Your throat may feel dry or sore from the anesthesia or the breathing tube placed in your throat during surgery. If this causes discomfort, gargle with warm salt water. The discomfort should disappear within 24 hours.      

## 2021-09-21 ENCOUNTER — Other Ambulatory Visit: Payer: Self-pay

## 2021-09-21 ENCOUNTER — Encounter (HOSPITAL_BASED_OUTPATIENT_CLINIC_OR_DEPARTMENT_OTHER): Payer: Self-pay | Admitting: Orthopedic Surgery

## 2021-09-21 ENCOUNTER — Ambulatory Visit (HOSPITAL_BASED_OUTPATIENT_CLINIC_OR_DEPARTMENT_OTHER)
Admission: RE | Admit: 2021-09-21 | Discharge: 2021-09-21 | Disposition: A | Discharge status: Home / Self Care | Payer: 59 | Attending: Orthopedic Surgery | Admitting: Orthopedic Surgery

## 2021-09-21 ENCOUNTER — Ambulatory Visit (HOSPITAL_BASED_OUTPATIENT_CLINIC_OR_DEPARTMENT_OTHER): Payer: 59 | Admitting: Anesthesiology

## 2021-09-21 ENCOUNTER — Encounter (HOSPITAL_BASED_OUTPATIENT_CLINIC_OR_DEPARTMENT_OTHER)
Admission: RE | Disposition: A | Discharge status: Home / Self Care | Payer: Self-pay | Source: Home / Self Care | Attending: Orthopedic Surgery

## 2021-09-21 DIAGNOSIS — E3451 Complete androgen insensitivity syndrome: Secondary | ICD-10-CM | POA: Diagnosis not present

## 2021-09-21 DIAGNOSIS — I440 Atrioventricular block, first degree: Secondary | ICD-10-CM | POA: Insufficient documentation

## 2021-09-21 DIAGNOSIS — G5601 Carpal tunnel syndrome, right upper limb: Secondary | ICD-10-CM | POA: Insufficient documentation

## 2021-09-21 DIAGNOSIS — Z7984 Long term (current) use of oral hypoglycemic drugs: Secondary | ICD-10-CM | POA: Diagnosis not present

## 2021-09-21 DIAGNOSIS — M545 Low back pain, unspecified: Secondary | ICD-10-CM | POA: Diagnosis not present

## 2021-09-21 DIAGNOSIS — G4730 Sleep apnea, unspecified: Secondary | ICD-10-CM | POA: Diagnosis not present

## 2021-09-21 DIAGNOSIS — K219 Gastro-esophageal reflux disease without esophagitis: Secondary | ICD-10-CM | POA: Insufficient documentation

## 2021-09-21 DIAGNOSIS — M654 Radial styloid tenosynovitis [de Quervain]: Secondary | ICD-10-CM

## 2021-09-21 DIAGNOSIS — E119 Type 2 diabetes mellitus without complications: Secondary | ICD-10-CM | POA: Diagnosis not present

## 2021-09-21 DIAGNOSIS — Z8616 Personal history of COVID-19: Secondary | ICD-10-CM | POA: Diagnosis not present

## 2021-09-21 DIAGNOSIS — Z79899 Other long term (current) drug therapy: Secondary | ICD-10-CM | POA: Insufficient documentation

## 2021-09-21 DIAGNOSIS — Z9989 Dependence on other enabling machines and devices: Secondary | ICD-10-CM | POA: Diagnosis not present

## 2021-09-21 DIAGNOSIS — G4733 Obstructive sleep apnea (adult) (pediatric): Secondary | ICD-10-CM | POA: Diagnosis not present

## 2021-09-21 HISTORY — DX: Personal history of other diseases of the digestive system: Z87.19

## 2021-09-21 HISTORY — PX: CARPAL TUNNEL RELEASE: SHX101

## 2021-09-21 HISTORY — DX: Presence of spectacles and contact lenses: Z97.3

## 2021-09-21 HISTORY — DX: Presence of other specified functional implants: Z96.89

## 2021-09-21 LAB — POCT I-STAT, CHEM 8
BUN: 26 mg/dL — ABNORMAL HIGH (ref 6–20)
Calcium, Ion: 1.19 mmol/L (ref 1.15–1.40)
Chloride: 103 mmol/L (ref 98–111)
Creatinine, Ser: 1.2 mg/dL — ABNORMAL HIGH (ref 0.44–1.00)
Glucose, Bld: 143 mg/dL — ABNORMAL HIGH (ref 70–99)
HCT: 34 % — ABNORMAL LOW (ref 36.0–46.0)
Hemoglobin: 11.6 g/dL — ABNORMAL LOW (ref 12.0–15.0)
Potassium: 3.7 mmol/L (ref 3.5–5.1)
Sodium: 140 mmol/L (ref 135–145)
TCO2: 23 mmol/L (ref 22–32)

## 2021-09-21 SURGERY — CARPAL TUNNEL RELEASE
Anesthesia: Monitor Anesthesia Care | Site: Hand | Laterality: Right

## 2021-09-21 MED ORDER — 0.9 % SODIUM CHLORIDE (POUR BTL) OPTIME
TOPICAL | Status: DC | PRN
  Administered 2021-09-21: 500 mL

## 2021-09-21 MED ORDER — BUPIVACAINE HCL (PF) 0.5 % IJ SOLN
INTRAMUSCULAR | Status: DC | PRN
  Administered 2021-09-21: 10 mL

## 2021-09-21 MED ORDER — LACTATED RINGERS IV SOLN: INTRAVENOUS | Status: DC

## 2021-09-21 MED ORDER — HYDROCODONE-ACETAMINOPHEN 5-325 MG PO TABS: 1.0000 | ORAL_TABLET | Freq: Four times a day (QID) | ORAL | 0 refills | Status: AC | PRN

## 2021-09-21 MED ORDER — OXYCODONE HCL 5 MG/5ML PO SOLN: 5.0000 mg | Freq: Once | ORAL | Status: DC | PRN

## 2021-09-21 MED ORDER — BUPIVACAINE HCL (PF) 0.5 % IJ SOLN
INTRAMUSCULAR | Status: AC
  Filled 2021-09-21: qty 30

## 2021-09-21 MED ORDER — ACETAMINOPHEN 500 MG PO TABS: 1000.0000 mg | ORAL_TABLET | Freq: Once | ORAL | Status: DC

## 2021-09-21 MED ORDER — ONDANSETRON HCL 4 MG/2ML IJ SOLN
INTRAMUSCULAR | Status: AC
  Filled 2021-09-21: qty 2

## 2021-09-21 MED ORDER — FENTANYL CITRATE (PF) 100 MCG/2ML IJ SOLN
INTRAMUSCULAR | Status: AC
  Filled 2021-09-21: qty 2

## 2021-09-21 MED ORDER — OXYCODONE HCL 5 MG PO TABS: 5.0000 mg | ORAL_TABLET | Freq: Once | ORAL | Status: DC | PRN

## 2021-09-21 MED ORDER — PROPOFOL 500 MG/50ML IV EMUL
INTRAVENOUS | Status: AC
  Filled 2021-09-21: qty 50

## 2021-09-21 MED ORDER — LIDOCAINE HCL 1 % IJ SOLN
INTRAMUSCULAR | Status: AC
  Filled 2021-09-21: qty 20

## 2021-09-21 MED ORDER — LIDOCAINE HCL (PF) 1 % IJ SOLN
INTRAMUSCULAR | Status: DC | PRN
  Administered 2021-09-21: 10 mL

## 2021-09-21 MED ORDER — LIDOCAINE HCL (CARDIAC) PF 100 MG/5ML IV SOSY
PREFILLED_SYRINGE | INTRAVENOUS | Status: DC | PRN
  Administered 2021-09-21: 60 mg via INTRAVENOUS
  Administered 2021-09-21: 40 mg via INTRAVENOUS

## 2021-09-21 MED ORDER — PROPOFOL 10 MG/ML IV BOLUS
INTRAVENOUS | Status: AC
  Filled 2021-09-21: qty 20

## 2021-09-21 MED ORDER — ACETAMINOPHEN 500 MG PO TABS: 1000.0000 mg | ORAL_TABLET | Freq: Once | ORAL | Status: DC | PRN

## 2021-09-21 MED ORDER — ACETAMINOPHEN 160 MG/5ML PO SOLN: 1000.0000 mg | Freq: Once | ORAL | Status: DC | PRN

## 2021-09-21 MED ORDER — LIDOCAINE HCL (PF) 2 % IJ SOLN
INTRAMUSCULAR | Status: AC
  Filled 2021-09-21: qty 5

## 2021-09-21 MED ORDER — DEXAMETHASONE SODIUM PHOSPHATE 4 MG/ML IJ SOLN
INTRAMUSCULAR | Status: DC | PRN
  Administered 2021-09-21: 5 mg via INTRAVENOUS

## 2021-09-21 MED ORDER — PROPOFOL 10 MG/ML IV BOLUS
INTRAVENOUS | Status: DC | PRN
  Administered 2021-09-21: 30 mg via INTRAVENOUS

## 2021-09-21 MED ORDER — CEFAZOLIN SODIUM-DEXTROSE 2-4 GM/100ML-% IV SOLN
2.0000 g | INTRAVENOUS | Status: AC
  Administered 2021-09-21: 2 g via INTRAVENOUS

## 2021-09-21 MED ORDER — FENTANYL CITRATE (PF) 100 MCG/2ML IJ SOLN: 25.0000 ug | INTRAMUSCULAR | Status: DC | PRN

## 2021-09-21 MED ORDER — CEFAZOLIN SODIUM-DEXTROSE 2-4 GM/100ML-% IV SOLN
INTRAVENOUS | Status: AC
  Filled 2021-09-21: qty 100

## 2021-09-21 MED ORDER — PROPOFOL 500 MG/50ML IV EMUL
INTRAVENOUS | Status: DC | PRN
  Administered 2021-09-21: 75 ug/kg/min via INTRAVENOUS

## 2021-09-21 MED ORDER — ACETAMINOPHEN 10 MG/ML IV SOLN: 1000.0000 mg | Freq: Once | INTRAVENOUS | Status: DC | PRN

## 2021-09-21 MED ORDER — MIDAZOLAM HCL 2 MG/2ML IJ SOLN
INTRAMUSCULAR | Status: AC
  Filled 2021-09-21: qty 2

## 2021-09-21 MED ORDER — MIDAZOLAM HCL 5 MG/5ML IJ SOLN
INTRAMUSCULAR | Status: DC | PRN
Start: 2021-09-21 — End: 2021-09-21
  Administered 2021-09-21: 2 mg via INTRAVENOUS

## 2021-09-21 MED ORDER — FENTANYL CITRATE (PF) 100 MCG/2ML IJ SOLN
INTRAMUSCULAR | Status: DC | PRN
  Administered 2021-09-21 (×4): 25 ug via INTRAVENOUS

## 2021-09-21 MED ORDER — ONDANSETRON HCL 4 MG/2ML IJ SOLN
INTRAMUSCULAR | Status: DC | PRN
  Administered 2021-09-21: 4 mg via INTRAVENOUS

## 2021-09-21 SURGICAL SUPPLY — 28 items
BLADE SURG 15 STRL LF DISP TIS (BLADE) ×1 IMPLANT
BLADE SURG 15 STRL SS (BLADE) ×2
BNDG CMPR 9X4 STRL LF SNTH (GAUZE/BANDAGES/DRESSINGS) ×1
BNDG ELASTIC 4X5.8 VLCR STR LF (GAUZE/BANDAGES/DRESSINGS) ×2 IMPLANT
BNDG ESMARK 4X9 LF (GAUZE/BANDAGES/DRESSINGS) ×2 IMPLANT
COVER BACK TABLE 60X90IN (DRAPES) ×2 IMPLANT
CUFF TOURN SGL QUICK 24 (TOURNIQUET CUFF) ×2
CUFF TRNQT CYL 24X4X16.5-23 (TOURNIQUET CUFF) ×1 IMPLANT
DRAPE EXTREMITY T 121X128X90 (DISPOSABLE) ×2 IMPLANT
GAUZE 4X4 16PLY ~~LOC~~+RFID DBL (SPONGE) ×2 IMPLANT
GAUZE SPONGE 4X4 12PLY STRL (GAUZE/BANDAGES/DRESSINGS) ×2 IMPLANT
GAUZE XEROFORM 1X8 LF (GAUZE/BANDAGES/DRESSINGS) ×2 IMPLANT
GOWN STRL REUS W/ TWL LRG LVL3 (GOWN DISPOSABLE) ×1 IMPLANT
GOWN STRL REUS W/TWL LRG LVL3 (GOWN DISPOSABLE) ×2
HIBICLENS CHG 4% 4OZ BTL (MISCELLANEOUS) ×2 IMPLANT
KIT TURNOVER CYSTO (KITS) ×2 IMPLANT
KNIFE CARPAL TUNNEL (BLADE) ×2 IMPLANT
NEEDLE HYPO 22GX1.5 SAFETY (NEEDLE) ×2 IMPLANT
NS IRRIG 500ML POUR BTL (IV SOLUTION) ×2 IMPLANT
PACK BASIN DAY SURGERY FS (CUSTOM PROCEDURE TRAY) ×2 IMPLANT
PAD CAST 4YDX4 CTTN HI CHSV (CAST SUPPLIES) ×1 IMPLANT
PADDING CAST COTTON 4X4 STRL (CAST SUPPLIES) ×2
SUT ETHILON 4 0 PS 2 18 (SUTURE) ×2 IMPLANT
SYR 10ML LL (SYRINGE) ×3 IMPLANT
SYR BULB EAR ULCER 3OZ GRN STR (SYRINGE) ×2 IMPLANT
TOWEL OR 17X26 10 PK STRL BLUE (TOWEL DISPOSABLE) ×2 IMPLANT
TRAY DSU PREP LF (CUSTOM PROCEDURE TRAY) ×2 IMPLANT
UNDERPAD 30X36 HEAVY ABSORB (UNDERPADS AND DIAPERS) ×2 IMPLANT

## 2021-09-21 NOTE — Anesthesia Preprocedure Evaluation (Signed)
Anesthesia Evaluation  Patient identified by MRN, date of birth, ID band Patient awake    Reviewed: Allergy & Precautions, NPO status , Patient's Chart, lab work & pertinent test results  History of Anesthesia Complications (+) PONV and history of anesthetic complications  Airway Mallampati: III  TM Distance: >3 FB Neck ROM: Full    Dental  (+) Dental Advisory Given, Teeth Intact   Pulmonary shortness of breath, sleep apnea and Continuous Positive Airway Pressure Ventilation , neg COPD, neg recent URI,    breath sounds clear to auscultation       Cardiovascular (-) hypertension(-) angina(-) Past MI + dysrhythmias  Rhythm:Regular  Heart palpatations   Neuro/Psych Spinal cord stimulator for low back pain  Neuromuscular disease    GI/Hepatic Neg liver ROS, hiatal hernia, GERD  ,  Endo/Other  diabetes, Type 2  Renal/GU Renal InsufficiencyRenal diseaseLab Results      Component                Value               Date                      CREATININE               1.20 (H)            09/21/2021                Musculoskeletal   Abdominal   Peds  Hematology  (+) Blood dyscrasia, anemia , Lab Results      Component                Value               Date                      WBC                      9.6                 09/19/2018                HGB                      11.6 (L)            09/21/2021                HCT                      34.0 (L)            09/21/2021                MCV                      85.2                09/19/2018                PLT                      326.0               09/19/2018              Anesthesia Other Findings   Reproductive/Obstetrics  Anesthesia Physical Anesthesia Plan  ASA: 3  Anesthesia Plan: MAC   Post-op Pain Management:    Induction: Intravenous  PONV Risk Score and Plan: 3 and Propofol infusion and Treatment may vary  due to age or medical condition  Airway Management Planned: Nasal Cannula  Additional Equipment: None  Intra-op Plan:   Post-operative Plan:   Informed Consent: I have reviewed the patients History and Physical, chart, labs and discussed the procedure including the risks, benefits and alternatives for the proposed anesthesia with the patient or authorized representative who has indicated his/her understanding and acceptance.     Dental advisory given  Plan Discussed with: CRNA and Anesthesiologist  Anesthesia Plan Comments:         Anesthesia Quick Evaluation

## 2021-09-21 NOTE — Anesthesia Procedure Notes (Signed)
Procedure Name: MAC Date/Time: 09/21/2021 8:50 AM Performed by: Justice Rocher, CRNA Pre-anesthesia Checklist: Timeout performed, Patient being monitored, Suction available, Emergency Drugs available and Patient identified Patient Re-evaluated:Patient Re-evaluated prior to induction Oxygen Delivery Method: Simple face mask Preoxygenation: Pre-oxygenation with 100% oxygen Induction Type: IV induction Placement Confirmation: breath sounds checked- equal and bilateral, CO2 detector and positive ETCO2

## 2021-09-21 NOTE — Transfer of Care (Signed)
Immediate Anesthesia Transfer of Care Note  Patient: VALECIA BESKE  Procedure(s) Performed: Procedure(s) (LRB): Right Carpal Tunnel Release + Right Dequervains first dorsal compartment release (Right)  Patient Location: Phase 2  Anesthesia Type: MAC  Level of Consciousness: awake, alert , oriented and patient cooperative  Airway & Oxygen Therapy: Patient Spontanous Breathing and Patient on room air Post-op Assessment: Report given to Phase 2 RN and Post -op Vital signs reviewed and stable  Post vital signs: Reviewed and stable  Complications: No apparent anesthesia complications  Last Vitals:  Vitals Value Taken Time  BP 111/73 09/21/21 0933  Temp    Pulse 64 09/21/21 0935  Resp 10 09/21/21 0935  SpO2 97 % 09/21/21 0935  Vitals shown include unvalidated device data.  Last Pain:  Vitals:   09/21/21 0705  TempSrc: Oral  PainSc: 0-No pain      Patients Stated Pain Goal: 3 (20/23/34 3568)  Complications: No notable events documented.

## 2021-09-21 NOTE — Interval H&P Note (Signed)
History and Physical Interval Note:  09/21/2021 8:04 AM  Alexis Black  has presented today for surgery, with the diagnosis of Right Carpal Tunnel Syndrome, Right dequervains syndrome.  The various methods of treatment have been discussed with the patient and family. After consideration of risks, benefits and other options for treatment, the patient has consented to  Procedure(s) with comments: Right Carpal Tunnel Release + Right Dequervains first dorsal compartment release (Right) - with local anesthesia as a surgical intervention.  The patient's history has been reviewed, patient examined, no change in status, stable for surgery.  I have reviewed the patient's chart and labs.  Questions were answered to the patient's satisfaction.     [name]

## 2021-09-21 NOTE — Op Note (Signed)
OPERATIVE NOTE  DATE OF PROCEDURE: 09/21/2021  SURGEONS:  Primary: [name], MD  PREOPERATIVE DIAGNOSIS: Right Carpal Tunnel Syndrome, Right dequervains syndrome  POSTOPERATIVE DIAGNOSIS: Same  NAME OF PROCEDURE:   Right DeQuervains First Dorsal Compartment Release Right carpal tunnel release  ANESTHESIA: MAC + Local  SKIN PREPARATION: Hibiclens  ESTIMATED BLOOD LOSS: Minimal  IMPLANTS: None  INDICATIONS:  Alexis Black is a 61 y.o. female who has the above preoperative diagnosis. The patient has decided to proceed with surgical intervention.  Risks, benefits and alternatives of operative management were discussed including, but not limited to, risks of anesthesia complications, infection, pain, persistent symptoms, stiffness, need for future surgery.  The patient understands, agrees and elects to proceed with surgery.    DESCRIPTION OF PROCEDURE: The patient was met in the pre-operative area and their identity was verified.  The operative location and laterality was also verified and marked.  The patient was brought to the OR and was placed supine on the table.  After repeat patient identification with the operative team anesthesia was provided and the patient was prepped and draped in the usual sterile fashion.  A final timeout was performed verifying the correction patient, procedure, location and laterality.  The right upper extremity was elevated and exsanguinated with an Esmarch. The tourniquet was inflated to 26mHg. An oblique incision incision was made over the first dorsal compartment of the wrist. Skin and subcutaneous tissue was divided and care was taken to protect the radial sensory nerve. The first dorsal compartment tendon sheath was identified and clear of all superficial tissue and nerve branches and veins were protected. The sheath was divided and the most dorsal aspect in line with the APL and EPB tendons. Tenosynovium was removed. 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 The APL tendon with multiple slips was freed from proximal to distal. The EPB tendon had a separate subsheath that was also incised and completely released.   A standard 1.5 cm incision was made in the midpalm of the RIGHT hand.  This was carried down through the subcutaneous tissues and palmar fascia to the transverse carpal ligament.  The distal one-half of the transverse carpal ligament was incised longitudinally under direct vision using a 15 blade.  The carpal tunnel release guide was then placed under direct vision on the transverse carpal ligament and slid proximally.  The guide was palpated into appropriate alignment longitudinally.  Contact with the transverse carpal ligament was maintained throughout passing.  The blade was engaged into the guide and the remaining portion of the transverse carpal ligament released completely.  No other abnormalities were noted.  The wound was copiously irrigated and the skin closed using horizontal mattress 4-0 nylon sutures.   The wound was irrigated with normal saline and the skin was closed with 4-0 nylon sutures. A sterile soft bandage was applied and tourniquet deflated. The fingers were pink and warm and well perfused at the end of the procedure. All counts were correct x 2. The patient was awoken from anesthesia, tolerated the procedure well and was brought to PACU for recovery in stable condition.    JMatt Holmes MD

## 2021-09-22 NOTE — Anesthesia Postprocedure Evaluation (Signed)
Anesthesia Post Note  Patient: Alexis Black  Procedure(s) Performed: Right Carpal Tunnel Release + Right Dequervains first dorsal compartment release (Right: Hand)     Patient location during evaluation: PACU Anesthesia Type: MAC Level of consciousness: awake and alert Pain management: pain level controlled Vital Signs Assessment: post-procedure vital signs reviewed and stable Respiratory status: spontaneous breathing, nonlabored ventilation, respiratory function stable and patient connected to nasal cannula oxygen Cardiovascular status: stable and blood pressure returned to baseline Postop Assessment: no apparent nausea or vomiting Anesthetic complications: no   No notable events documented.  Last Vitals:  Vitals:   09/21/21 0936 09/21/21 0945  BP: 111/73 108/75  Pulse: 64 64  Resp: 10 13  Temp: 36.5 C   SpO2: 95% 98%    Last Pain:  Vitals:   09/21/21 1000  TempSrc:   PainSc: 0-No pain                 Keymari Sato

## 2021-09-23 ENCOUNTER — Encounter (HOSPITAL_BASED_OUTPATIENT_CLINIC_OR_DEPARTMENT_OTHER): Payer: Self-pay | Admitting: Orthopedic Surgery

## 2021-09-25 DIAGNOSIS — M79641 Pain in right hand: Secondary | ICD-10-CM | POA: Diagnosis not present

## 2021-10-06 DIAGNOSIS — G5601 Carpal tunnel syndrome, right upper limb: Secondary | ICD-10-CM | POA: Diagnosis not present

## 2021-10-06 DIAGNOSIS — M654 Radial styloid tenosynovitis [de Quervain]: Secondary | ICD-10-CM | POA: Diagnosis not present

## 2021-10-26 DIAGNOSIS — I10 Essential (primary) hypertension: Secondary | ICD-10-CM | POA: Diagnosis not present

## 2021-10-26 DIAGNOSIS — E785 Hyperlipidemia, unspecified: Secondary | ICD-10-CM | POA: Diagnosis not present

## 2021-10-26 DIAGNOSIS — E559 Vitamin D deficiency, unspecified: Secondary | ICD-10-CM | POA: Diagnosis not present

## 2021-10-26 DIAGNOSIS — E1151 Type 2 diabetes mellitus with diabetic peripheral angiopathy without gangrene: Secondary | ICD-10-CM | POA: Diagnosis not present

## 2021-10-28 DIAGNOSIS — M791 Myalgia, unspecified site: Secondary | ICD-10-CM | POA: Diagnosis not present

## 2021-10-28 DIAGNOSIS — R809 Proteinuria, unspecified: Secondary | ICD-10-CM | POA: Diagnosis not present

## 2021-10-28 DIAGNOSIS — Z794 Long term (current) use of insulin: Secondary | ICD-10-CM | POA: Diagnosis not present

## 2021-10-28 DIAGNOSIS — E1129 Type 2 diabetes mellitus with other diabetic kidney complication: Secondary | ICD-10-CM | POA: Diagnosis not present

## 2021-10-28 DIAGNOSIS — E785 Hyperlipidemia, unspecified: Secondary | ICD-10-CM | POA: Diagnosis not present

## 2021-11-25 ENCOUNTER — Ambulatory Visit: Payer: 59 | Admitting: Cardiovascular Disease

## 2021-12-02 DIAGNOSIS — G4733 Obstructive sleep apnea (adult) (pediatric): Secondary | ICD-10-CM | POA: Diagnosis not present

## 2021-12-04 ENCOUNTER — Ambulatory Visit: Payer: 59 | Admitting: Cardiovascular Disease

## 2021-12-24 ENCOUNTER — Other Ambulatory Visit (HOSPITAL_COMMUNITY): Payer: Self-pay

## 2021-12-24 MED ORDER — MOUNJARO 5 MG/0.5ML ~~LOC~~ SOAJ
5.0000 mg | SUBCUTANEOUS | 11 refills | Status: DC
Start: 2021-12-24 — End: 2022-03-10
  Filled 2021-12-24 – 2021-12-29 (×8): qty 2, 28d supply, fill #0
  Filled 2022-01-13 – 2022-01-26 (×3): qty 2, 28d supply, fill #1
  Filled 2022-03-07: qty 2, 28d supply, fill #2

## 2021-12-25 ENCOUNTER — Other Ambulatory Visit (HOSPITAL_COMMUNITY): Payer: Self-pay

## 2021-12-28 ENCOUNTER — Other Ambulatory Visit (HOSPITAL_COMMUNITY): Payer: Self-pay

## 2021-12-29 ENCOUNTER — Ambulatory Visit: Payer: 59 | Admitting: Cardiovascular Disease

## 2021-12-29 ENCOUNTER — Other Ambulatory Visit (HOSPITAL_COMMUNITY): Payer: Self-pay

## 2021-12-29 ENCOUNTER — Encounter: Payer: Self-pay | Admitting: Cardiovascular Disease

## 2021-12-29 VITALS — BP 112/68 | HR 74 | Ht 67.0 in | Wt 171.4 lb

## 2021-12-29 DIAGNOSIS — E782 Mixed hyperlipidemia: Secondary | ICD-10-CM

## 2021-12-29 DIAGNOSIS — R072 Precordial pain: Secondary | ICD-10-CM

## 2021-12-29 DIAGNOSIS — I10 Essential (primary) hypertension: Secondary | ICD-10-CM | POA: Diagnosis not present

## 2021-12-29 DIAGNOSIS — E785 Hyperlipidemia, unspecified: Secondary | ICD-10-CM | POA: Insufficient documentation

## 2021-12-29 DIAGNOSIS — R079 Chest pain, unspecified: Secondary | ICD-10-CM | POA: Diagnosis not present

## 2021-12-29 LAB — BASIC METABOLIC PANEL
BUN/Creatinine Ratio: 22 (ref 9–23)
BUN: 30 mg/dL — ABNORMAL HIGH (ref 6–24)
CO2: 23 mmol/L (ref 20–29)
Calcium: 9.5 mg/dL (ref 8.7–10.2)
Chloride: 99 mmol/L (ref 96–106)
Creatinine, Ser: 1.34 mg/dL — ABNORMAL HIGH (ref 0.57–1.00)
Glucose: 134 mg/dL — ABNORMAL HIGH (ref 70–99)
Potassium: 4.6 mmol/L (ref 3.5–5.2)
Sodium: 137 mmol/L (ref 134–144)
eGFR: 46 mL/min/{1.73_m2} — ABNORMAL LOW (ref >59)

## 2021-12-29 MED ORDER — METOPROLOL TARTRATE 100 MG PO TABS: 100.0000 mg | ORAL_TABLET | Freq: Once | ORAL | 0 refills | Status: DC

## 2021-12-29 NOTE — Assessment & Plan Note (Signed)
History of hyperlipidemia intolerant to statin therapy with recent lipid profile performed 10/26/2021 revealing total cholesterol 253, LDL 160 and HDL of 35.  We will explore initiating Repatha. ?

## 2021-12-29 NOTE — Patient Instructions (Addendum)
Medication Instructions:  ?Your physician recommends that you continue on your current medications as directed. Please refer to the Current Medication list given to you today. ? ?*If you need a refill on your cardiac medications before your next appointment, please call your pharmacy* ? ? ?Lab Work: ?Your physician recommends that you have labs drawn today: BMET ? ?If you have labs (blood work) drawn today and your tests are completely normal, you will receive your results only by: ?MyChart Message (if you have MyChart) OR ?A paper copy in the mail ?If you have any lab test that is abnormal or we need to change your treatment, we will call you to review the results. ? ? ?Testing/Procedures: ?Your physician has requested that you have an echocardiogram. Echocardiography is a painless test that uses sound waves to create images of your heart. It provides your doctor with information about the size and shape of your heart and how well your heart?s chambers and valves are working. This procedure takes approximately one hour. There are no restrictions for this procedure.This procedure will be done at [address] ? ? ? ?Follow-Up: ?At [facility], you and your health needs are our priority.  As part of our continuing mission to provide you with exceptional heart care, we have created designated Provider Care Teams.  These Care Teams include your primary Cardiologist (physician) and Advanced Practice Providers (APPs -  Physician Assistants and Nurse Practitioners) who all work together to provide you with the care you need, when you need it. ? ?We recommend signing up for the patient portal called "MyChart".  Sign up information is provided on this After Visit Summary.  MyChart is used to connect with patients for Virtual Visits (Telemedicine).  Patients are able to view lab/test results, encounter notes, upcoming appointments, etc.  Non-urgent messages can be sent to your provider as well.   ?To learn  more about what you can do with MyChart, go to [email or web address].   ? ?Your next appointment:   ?4-6 week(s) ? ?The format for your next appointment:   ?In Person ? ?Provider:   ?Quay Burow, MD ? ? ?Other Instructions ?Pt needs appointment with PharmD to discuss [id number].  ? ? ? ? ?Your cardiac CT will be scheduled at the below location:  ? ?[facility] ?[address] ?[place], [place] [address] ?[phone] ? ? ? ?If scheduled at [facility], please arrive at the [facility] and Children's Entrance (Entrance C2) of [facility] 30 minutes prior to test start time. ?You can use the FREE valet parking offered at entrance C (encouraged to control the heart rate for the test)  ?Proceed to the [facility] Radiology Department (first floor) to check-in and test prep. ? ?All radiology patients and guests should use entrance C2 at [facility], accessed from [facility], even though the hospital's physical address listed is [address]. ? ? ? ? ?Please follow these instructions carefully (unless otherwise directed): ? ? ?On the Night Before the Test: ?Be sure to Drink plenty of water. ?Do not consume any caffeinated/decaffeinated beverages or chocolate 12 hours prior to your test. ?Do not take any antihistamines 12 hours prior to your test. ? ? ?On the Day of the Test: ?Drink plenty of water until 1 hour prior to the test. ?Do not eat any food 4 hours prior to the test. ?You may take your regular medications prior to the test.  ?Take metoprolol (Lopressor)  $'100mg'M$  two hours prior to test. ?HOLD Furosemide/Hydrochlorothiazide morning of the test. ?FEMALES- please wear underwire-free bra if available, avoid dresses & tight clothing ? ?After the Test: ?Drink plenty of water. ?After receiving IV contrast, you may experience a mild flushed feeling. This is normal. ?On occasion, you may experience a mild rash up to 24 hours after the test. This is not dangerous. If  this occurs, you can take Benadryl 25 mg and increase your fluid intake. ?If you experience trouble breathing, this can be serious. If it is severe call 911 IMMEDIATELY. If it is mild, please call our office. ?If you take any of these medications: Glipizide/Metformin, Avandament, Glucavance, please do not take 48 hours after completing test unless otherwise instructed. ? ?We will call to schedule your test 2-4 weeks out understanding that some insurance companies will need an authorization prior to the service being performed.  ? ?For non-scheduling related questions, please contact the cardiac imaging nurse navigator should you have any questions/concerns: ?Marchia Bond, Cardiac Imaging Nurse Navigator ?Gordy Clement, Cardiac Imaging Nurse Navigator ?[facility] ?Direct Office Dial: [phone]  ? ?For scheduling needs, including cancellations and rescheduling, please call [place], [phone]. ?

## 2021-12-29 NOTE — Assessment & Plan Note (Signed)
History of essential hypertension with blood pressure measured today at 112/68.  She is on amlodipine, Micardis, metoprolol and hydrochlorothiazide. ?

## 2021-12-29 NOTE — Progress Notes (Signed)
? ? ? ?12/29/2021 ?Alexis Black   ?1961/04/19  ?[id number] ? ?Primary Physician Donnajean Lopes, MD ?Primary Cardiologist: Lorretta Harp MD [name], [place] ? ?HPI:  Alexis Black is a 61 y.o. mildly overweight married Caucasian female mother of 47, grandmother 1 grandchild who currently does not work.  She was referred back to Dr. Sharlett Iles, her PCP, for evaluation of chest pain.  Her mother, Alexis Black, was also a patient of mine who passed away in 11/25/13.  Her risk factors include treated hypertension and type 2 diabetes, untreated hyperlipidemia because of statin intolerance and family history with a father who had stents.  She is never had a heart attack or stroke.  She has lost 55 pounds over the last several months after stopping starting Ozempic.  She walks on occasion but is not particularly active.  She had an episode of chest pain back in December as well as in February.  She was admitted for 1 day in 11-26-2010 for chest pain and had a negative Myoview stress test at that time. ? ? ?Current Meds  ?Medication Sig  ? amLODipine (NORVASC) 2.5 MG tablet Take 2.5 mg by mouth daily.  ? Coenzyme Q10 50 MG CAPS Take by mouth daily.  ? Continuous Blood Gluc Sensor (FREESTYLE LIBRE 2 SENSOR) MISC by Does not apply route.  ? dapagliflozin propanediol (FARXIGA) 5 MG TABS tablet Take by mouth daily.  ? escitalopram (LEXAPRO) 5 MG tablet Take 5 mg by mouth as needed.  ? Estradiol 10 MCG TABS vaginal tablet as needed.  ? gabapentin (NEURONTIN) 300 MG capsule 300 mg at bedtime.  ? glucagon 1 MG injection 1 mg SQ prn hypoglycemia  ? metFORMIN (GLUMETZA) 500 MG (MOD) 24 hr tablet Take 1,000 mg by mouth 2 (two) times daily with a meal.  ? methylphenidate (METADATE CD) 40 MG CR capsule 1 capsule before breakfast in the morning  ? metoprolol tartrate (LOPRESSOR) 25 MG tablet Take 25 mg by mouth at bedtime.  ? Multiple Vitamin (MULTIVITAMIN) tablet Take 1 tablet by mouth daily.  ? Omega-3 1000 MG CAPS Take by mouth  daily.  ? progesterone (PROMETRIUM) 100 MG capsule Take 100 mg by mouth at bedtime.  ? RABEprazole (ACIPHEX) 20 MG tablet Take 20 mg by mouth daily.  ? telmisartan-hydrochlorothiazide (MICARDIS HCT) 80-12.5 MG tablet Take 1 tablet by mouth daily.  ? tirzepatide (MOUNJARO) 5 MG/0.5ML Pen Inject 5 mg into the skin once a week.  ? tirzepatide [facility]) 5 MG/0.5ML Pen Inject into the skin.  ? VITAMIN D PO Take by mouth as needed.  ? [DISCONTINUED] metFORMIN (GLUCOPHAGE) 500 MG tablet 1,000 mg.  ?  ? ?Allergies  ?Allergen Reactions  ? Actos [Pioglitazone] Other (See Comments)  ?  bloated  ? Dulaglutide   ?  Other reaction(s): nausea with trulicity  ? Pravachol [Pravastatin]   ?  Other reaction(s): had muscle cramps  ? Quinapril Hcl   ?  Other reaction(s): Unknown  ? ? ?Social History  ? ?Socioeconomic History  ? Marital status: Married  ?  Spouse name: Not on file  ? Number of children: 1  ? Years of education: Not on file  ? Highest education level: Not on file  ?Occupational History  ? Not on file  ?Tobacco Use  ? Smoking status: Never  ? Smokeless tobacco: Never  ?Vaping Use  ? Vaping Use: Never used  ?Substance and Sexual Activity  ? Alcohol use: No  ? Drug use: No  ?  Sexual activity: Not on file  ?Other Topics Concern  ? Not on file  ?Social History Narrative  ? Not on file  ? ?Social Determinants of Health  ? ?Financial Resource Strain: Not on file  ?Food Insecurity: Not on file  ?Transportation Needs: Not on file  ?Physical Activity: Not on file  ?Stress: Not on file  ?Social Connections: Not on file  ?Intimate Partner Violence: Not on file  ?  ? ?Review of Systems: ?General: negative for chills, fever, night sweats or weight changes.  ?Cardiovascular: negative for chest pain, dyspnea on exertion, edema, orthopnea, palpitations, paroxysmal nocturnal dyspnea or shortness of breath ?Dermatological: negative for rash ?Respiratory: negative for cough or wheezing ?Urologic: negative for hematuria ?Abdominal: negative  for nausea, vomiting, diarrhea, bright red blood per rectum, melena, or hematemesis ?Neurologic: negative for visual changes, syncope, or dizziness ?All other systems reviewed and are otherwise negative except as noted above. ? ? ? ?Blood pressure 112/68, pulse 74, height '5\' 7"'$  (1.702 m), weight 171 lb 6.4 oz (77.7 kg), SpO2 98 %.  ?General appearance: alert and no distress ?Neck: no adenopathy, no carotid bruit, no JVD, supple, symmetrical, trachea midline, and thyroid not enlarged, symmetric, no tenderness/mass/nodules ?Lungs: clear to auscultation bilaterally ?Heart: regular rate and rhythm, S1, S2 normal, no murmur, click, rub or gallop ?Extremities: extremities normal, atraumatic, no cyanosis or edema ?Pulses: 2+ and symmetric ?Skin: Skin color, texture, turgor normal. No rashes or lesions ?Neurologic: Grossly normal ? ?EKG sinus rhythm at 74 without ST or T wave changes.  Personally reviewed this EKG. ? ?ASSESSMENT AND PLAN:  ? ?Hyperlipidemia ?History of hyperlipidemia intolerant to statin therapy with recent lipid profile performed 10/26/2021 revealing total cholesterol 253, LDL 160 and HDL of 35.  We will explore initiating Repatha. ? ?Essential hypertension ?History of essential hypertension with blood pressure measured today at 112/68.  She is on amlodipine, Micardis, metoprolol and hydrochlorothiazide. ? ?Chest pain of uncertain etiology ?Ms. Alexis Black was referred to me by Dr. Sharlett Iles for chest pain.  She has had 2 episodes, one in December and 1 in February.  Risk factors include treated hypertension, type 2 diabetes, untreated hyperlipidemia because of statin intolerance and family history.  Her father had stents.  I am going to get a 2D echocardiogram and a coronary CTA to further evaluate. ? ? ? ? ?Lorretta Harp MD FACP,FACC,FAHA, FSCAI ?12/29/2021 ?10:19 AM ?

## 2021-12-29 NOTE — Assessment & Plan Note (Signed)
Alexis Black was referred to me by Dr. Sharlett Iles for chest pain.  She has had 2 episodes, one in December and 1 in February.  Risk factors include treated hypertension, type 2 diabetes, untreated hyperlipidemia because of statin intolerance and family history.  Her father had stents.  I am going to get a 2D echocardiogram and a coronary CTA to further evaluate. ?

## 2022-01-02 DIAGNOSIS — G4733 Obstructive sleep apnea (adult) (pediatric): Secondary | ICD-10-CM | POA: Diagnosis not present

## 2022-01-05 ENCOUNTER — Telehealth: Payer: Self-pay

## 2022-01-05 ENCOUNTER — Ambulatory Visit (INDEPENDENT_AMBULATORY_CARE_PROVIDER_SITE_OTHER): Payer: 59 | Admitting: Pharmacist

## 2022-01-05 ENCOUNTER — Telehealth: Payer: Self-pay | Admitting: Pharmacist

## 2022-01-05 VITALS — BP 134/86 | HR 80 | Resp 17 | Ht 67.0 in | Wt 172.6 lb

## 2022-01-05 DIAGNOSIS — E782 Mixed hyperlipidemia: Secondary | ICD-10-CM | POA: Diagnosis not present

## 2022-01-05 NOTE — Telephone Encounter (Signed)
Please complete prior authorization for: ? ?Name of medication, dose, and frequency Praluent '75mg'$  sq q 14 days or Repatha '140mg'$  sq q 14 days ? ?Lab Orders Requested? yes ? ?Which labs? Lipid panel ? ?Estimated date for labs to be scheduled 2-3 months ? ?Does patient need activated copay card? yes  ?

## 2022-01-05 NOTE — Progress Notes (Signed)
Patient ID: Alexis Black                 DOB: Jun 10, 1961                    MRN: [id number] ? ? ? ? ?HPI: ?Alexis Black is a 61 y.o. female patient referred to lipid clinic by Dr Gwenlyn Found. PMH is significant for HTN, T2DM, HLD, and statin intolerance. Patient has tried rosuvastatin, pravastatin and fenofibrate. ? ?Patient presents today in good spirits.  Referred to Dr Gwenlyn Found for chest pain.  Has coronary CT scheduled for next week. ? ?Has a significant family history of HLD, CAD, and DM.  Patient DM controlled, last A1c 6.4 in Feb 2023. ? ?Patient reports lipid and triglyceride levels have been variable, typically corresponding with her blood sugar levels.  Now she wears a continuous glucose monitor and is better controlled. She reports she has a sweet tooth and likes to bake. ? ?Has tried statins in the past and all caused muscle pain. Reports she was able to tolerate fenofibrate but it was discontinued. ? ?Current Medications: N/A ? ?Intolerances:  ?Rosuvastatin ?Pravastatin ? ?Risk Factors:  ?T2DM ?Family history ?HTN ?Angina ? ?LDL goal: <55 ? ?Labs: TC 253, HDL 35, LDL 160, Trigs 291 (10/26/21) ? ?Past Medical History:  ?Diagnosis Date  ? Deafness in left ear   ? DM Type 2   ? Family history of adverse reaction to anesthesia   ? mother - PONV  ? GERD (gastroesophageal reflux disease)   ? Heart palpitations   ? "with caffeine" occ has  ? History of bronchitis   ? last time 02-2021  ? History of COVID-19 02/2021  ? bronchitis symptoms x 4 to 5 days all symptoms resolved  ? History of hiatal hernia   ? Numbness and tingling of leg   ? burning both feet @ times  ? PONV (postoperative nausea and vomiting)   ? S/P insertion of spinal cord stimulator   ? Shortness of breath dyspnea   ? "with caffiene"   ? Sleep apnea   ? "diagnosed over 10 years ago but I don't wear a CPAP per pt on 09-16-2021  ? Wears glasses   ? Wears hearing aid in left ear 09/16/2021  ? sometimes wears  per pt  ? ? ?Current Outpatient Medications on  File Prior to Visit  ?Medication Sig Dispense Refill  ? amLODipine (NORVASC) 2.5 MG tablet Take 2.5 mg by mouth daily.  12  ? Coenzyme Q10 50 MG CAPS Take by mouth daily.    ? Continuous Blood Gluc Sensor (FREESTYLE LIBRE 2 SENSOR) MISC by Does not apply route.    ? dapagliflozin propanediol (FARXIGA) 5 MG TABS tablet Take by mouth daily.    ? escitalopram (LEXAPRO) 5 MG tablet Take 5 mg by mouth as needed.    ? Estradiol 10 MCG TABS vaginal tablet as needed.    ? fenofibrate 54 MG tablet Take 1 tablet by mouth daily. (Patient not taking: Reported on 12/29/2021)    ? gabapentin (NEURONTIN) 300 MG capsule 300 mg at bedtime.    ? glucagon 1 MG injection 1 mg SQ prn hypoglycemia    ? metFORMIN (GLUMETZA) 500 MG (MOD) 24 hr tablet Take 1,000 mg by mouth 2 (two) times daily with a meal.    ? methylphenidate (METADATE CD) 40 MG CR capsule 1 capsule before breakfast in the morning    ? metoprolol tartrate (LOPRESSOR) 100 MG tablet  Take 1 tablet (100 mg total) by mouth once for 1 dose. Take 2 hours prior to procedure. 1 tablet 0  ? metoprolol tartrate (LOPRESSOR) 25 MG tablet Take 25 mg by mouth at bedtime.    ? Multiple Vitamin (MULTIVITAMIN) tablet Take 1 tablet by mouth daily.    ? Omega-3 1000 MG CAPS Take by mouth daily.    ? progesterone (PROMETRIUM) 100 MG capsule Take 100 mg by mouth at bedtime.  12  ? RABEprazole (ACIPHEX) 20 MG tablet Take 20 mg by mouth daily.    ? Semaglutide,0.25 or 0.'5MG'$ /DOS, (OZEMPIC, 0.25 OR 0.5 MG/DOSE,) 2 MG/1.5ML SOPN tuesday (Patient not taking: Reported on 12/29/2021)    ? telmisartan-hydrochlorothiazide (MICARDIS HCT) 80-12.5 MG tablet Take 1 tablet by mouth daily.    ? tirzepatide (MOUNJARO) 5 MG/0.5ML Pen Inject 5 mg into the skin once a week. 2 mL 11  ? tirzepatide (MOUNJARO) 5 MG/0.5ML Pen Inject into the skin.    ? valACYclovir (VALTREX) 1000 MG tablet as needed. For fever blisters (Patient not taking: Reported on 12/29/2021)    ? VITAMIN D PO Take by mouth as needed.    ? ?No  current facility-administered medications on file prior to visit.  ? ? ?Allergies  ?Allergen Reactions  ? Actos [Pioglitazone] Other (See Comments)  ?  bloated  ? Dulaglutide   ?  Other reaction(s): nausea with trulicity  ? Pravachol [Pravastatin]   ?  Other reaction(s): had muscle cramps  ? Quinapril Hcl   ?  Other reaction(s): Unknown  ? ? ?Assessment/Plan: ? ?1. Hyperlipidemia - Patient's LDL 160 which is above goal of <55.  Aggressive goal selected due to T2DM, HTN, and family history.  Sicne patient is intolerant to statins and needs significant LDL lowering, recommended PCSK9i therapy. ? ?Using demo pen, educated patient on mechanism of action, storage, site selection, administration, and possible adverse effects. Patient very concerned about possible blood sugar increases. Will complete PA and activate copay card for patient.  Recheck lipid panel in 2-3 months. ? ?Karren Cobble, PharmD, BCACP, [place], CPP ?[address], [address] ?[place], [place], [address] ?Phone: [phone], Fax: [phone]  ?  ?

## 2022-01-05 NOTE — Patient Instructions (Signed)
It was nice meeting you today ? ?We would like your LDL (bad cholesterol) to be less than 55 ? ?Please continue to watch your diet and be physically active ? ?We will start a new medication called Praluent or Repatha which you will inject once every 2 weeks ? ?I will complete the prior authorization and activate a copay card for you ? ?Once you start the medication we will recheck your levels in 2-3 months ? ?Karren Cobble, PharmD, BCACP, [place], CPP ?[address], [address] ?[place], [place], [address] ?Phone: [phone], Fax: [phone]  ? ?

## 2022-01-05 NOTE — Telephone Encounter (Signed)
Pa sent to plan for praluent '75mg'$  q2w ?Sheletha Pulley (Key: [id number]) ?Praluent '75MG'$ /ML auto-injectors ?Status: Sent To Plan ?Created: May 2nd, 2023 ?Sent: May 2nd, 2023 ? ?Lipid panel was ordered and released by verbal instruction from chris pavero rph  ? ?Will contact the pt once approved.  ?

## 2022-01-05 NOTE — Telephone Encounter (Signed)
Pa sent to plan for praluent '75mg'$  q2w ?Shiori Marquess (Key: [id number]) ?Praluent '75MG'$ /ML auto-injectors ?Status: Sent To Plan ?Created: May 2nd, 2023 ?Sent: May 2nd, 2023 ?  ?Lipid panel was ordered and released by verbal instruction from chris pavero rph  ?  ?Will contact the pt once approved.  ?

## 2022-01-07 ENCOUNTER — Telehealth (HOSPITAL_COMMUNITY): Payer: Self-pay | Admitting: Emergency Medicine

## 2022-01-07 NOTE — Telephone Encounter (Signed)
Unable to leave message ?Marchia Bond RN Navigator Cardiac Imaging ?[facility] ?[phone] Office  ?[phone] Cell ? ?

## 2022-01-08 ENCOUNTER — Telehealth: Payer: Self-pay

## 2022-01-08 ENCOUNTER — Other Ambulatory Visit (HOSPITAL_COMMUNITY): Payer: Self-pay

## 2022-01-08 ENCOUNTER — Encounter (HOSPITAL_COMMUNITY): Payer: Self-pay

## 2022-01-08 ENCOUNTER — Ambulatory Visit (HOSPITAL_COMMUNITY)
Admission: RE | Admit: 2022-01-08 | Discharge: 2022-01-08 | Disposition: A | Discharge status: Home / Self Care | Payer: 59 | Source: Ambulatory Visit | Attending: Cardiovascular Disease | Admitting: Cardiovascular Disease

## 2022-01-08 ENCOUNTER — Other Ambulatory Visit: Payer: Self-pay | Admitting: Cardiology

## 2022-01-08 ENCOUNTER — Ambulatory Visit (HOSPITAL_COMMUNITY)
Admission: RE | Admit: 2022-01-08 | Discharge: 2022-01-08 | Disposition: A | Discharge status: Home / Self Care | Payer: 59 | Source: Ambulatory Visit | Attending: Cardiology | Admitting: Cardiology

## 2022-01-08 DIAGNOSIS — R931 Abnormal findings on diagnostic imaging of heart and coronary circulation: Secondary | ICD-10-CM | POA: Diagnosis not present

## 2022-01-08 DIAGNOSIS — R072 Precordial pain: Secondary | ICD-10-CM

## 2022-01-08 DIAGNOSIS — I2510 Atherosclerotic heart disease of native coronary artery without angina pectoris: Secondary | ICD-10-CM

## 2022-01-08 MED ORDER — IOHEXOL 350 MG/ML SOLN
100.0000 mL | Freq: Once | INTRAVENOUS | Status: AC | PRN
  Administered 2022-01-08: 100 mL via INTRAVENOUS

## 2022-01-08 MED ORDER — NITROGLYCERIN 0.4 MG SL SUBL
0.8000 mg | SUBLINGUAL_TABLET | Freq: Once | SUBLINGUAL | Status: AC
  Administered 2022-01-08: 0.8 mg via SUBLINGUAL

## 2022-01-08 MED ORDER — NITROGLYCERIN 0.4 MG SL SUBL
SUBLINGUAL_TABLET | SUBLINGUAL | Status: AC
  Filled 2022-01-08: qty 2

## 2022-01-08 NOTE — Telephone Encounter (Signed)
Spoke with pt regarding abnormal results from coronary CTA per Dr. Gwenlyn Found. Pt scheduled for office visit on 01/12/22. Pt verbalizes understanding.  ?

## 2022-01-08 NOTE — Telephone Encounter (Signed)
Attempt number multiple times, it just rings busy.  Will try again later. ?

## 2022-01-12 ENCOUNTER — Other Ambulatory Visit (HOSPITAL_COMMUNITY): Payer: Self-pay

## 2022-01-12 ENCOUNTER — Ambulatory Visit: Payer: 59 | Admitting: Cardiovascular Disease

## 2022-01-12 ENCOUNTER — Encounter: Payer: Self-pay | Admitting: Cardiovascular Disease

## 2022-01-12 VITALS — BP 110/56 | HR 69 | Ht 67.0 in | Wt 172.0 lb

## 2022-01-12 DIAGNOSIS — I25119 Atherosclerotic heart disease of native coronary artery with unspecified angina pectoris: Secondary | ICD-10-CM | POA: Diagnosis not present

## 2022-01-12 MED ORDER — ALIROCUMAB 75 MG/ML ~~LOC~~ SOAJ
75.0000 mg | SUBCUTANEOUS | 11 refills | Status: DC
  Filled 2022-01-12: qty 2, 28d supply, fill #0
  Filled 2022-02-10: qty 2, 28d supply, fill #1
  Filled 2022-02-24 – 2022-03-07 (×3): qty 2, 28d supply, fill #2
  Filled 2022-04-01: qty 2, 28d supply, fill #3

## 2022-01-12 NOTE — Progress Notes (Signed)
Ms. Decarli returns a for follow-up of her coronary CTA performed because of chest pain.  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  She has had 2 episodes of chest pain in the last 6 months, 1 in December and 1 in February.  She does have L elevated LDL intolerant to statin therapy.  I recommended Repatha.  At this point, I am going to continue to follow her conservatively but would have a low threshold to recommend coronary angiography should she have recurrent symptoms. ? ?Lorretta Harp, M.D., [place], [facility], [place], [place] ?[place] [address] ?[place], [place]  [address]  ?[phone] ?01/12/2022 ?9:10 AM  ?

## 2022-01-12 NOTE — Telephone Encounter (Signed)
Called and spoke to pt and stated that they were approved for praluent 75 and rx sent. Instructed the pt to contact praluent to get copay card at  [phone]) and to complete fasting labs post 4th dose and they voiced understanding ?

## 2022-01-12 NOTE — Addendum Note (Signed)
Addended by: Allean Found on: 01/12/2022 09:34 AM ? ? Modules accepted: Orders ? ?

## 2022-01-12 NOTE — Patient Instructions (Signed)
Medication Instructions:  ?The current medical regimen is effective;  continue present plan and medications. ? ?*If you need a refill on your cardiac medications before your next appointment, please call your pharmacy* ? ? ?Follow-Up: ?At [facility], you and your health needs are our priority.  As part of our continuing mission to provide you with exceptional heart care, we have created designated Provider Care Teams.  These Care Teams include your primary Cardiologist (physician) and Advanced Practice Providers (APPs -  Physician Assistants and Nurse Practitioners) who all work together to provide you with the care you need, when you need it. ? ?We recommend signing up for the patient portal called "MyChart".  Sign up information is provided on this After Visit Summary.  MyChart is used to connect with patients for Virtual Visits (Telemedicine).  Patients are able to view lab/test results, encounter notes, upcoming appointments, etc.  Non-urgent messages can be sent to your provider as well.   ?To learn more about what you can do with MyChart, go to [email or web address].   ? ?Your next appointment:   ?3 month(s) ? ?The format for your next appointment:   ?In Person ? ?Provider:   ?Any APP    Then, Quay Burow, MD will plan to see you again in 6 month(s).  ? ? ? ? ? ? ? ? ?

## 2022-01-13 ENCOUNTER — Ambulatory Visit (HOSPITAL_COMMUNITY): Payer: 59 | Attending: Cardiology

## 2022-01-13 ENCOUNTER — Other Ambulatory Visit (HOSPITAL_COMMUNITY): Payer: Self-pay

## 2022-01-13 DIAGNOSIS — R072 Precordial pain: Secondary | ICD-10-CM

## 2022-01-13 DIAGNOSIS — I10 Essential (primary) hypertension: Secondary | ICD-10-CM | POA: Diagnosis not present

## 2022-01-13 LAB — ECHOCARDIOGRAM COMPLETE
Area-P 1/2: 3.02 cm2
P 1/2 time: 636 msec
S' Lateral: 2.7 cm

## 2022-01-26 ENCOUNTER — Other Ambulatory Visit (HOSPITAL_COMMUNITY): Payer: Self-pay

## 2022-01-27 ENCOUNTER — Other Ambulatory Visit (HOSPITAL_COMMUNITY): Payer: Self-pay

## 2022-01-28 NOTE — Telephone Encounter (Signed)
Called and spoke w/pt who stated that they already had praluent approved and picked up med

## 2022-01-29 DIAGNOSIS — Z978 Presence of other specified devices: Secondary | ICD-10-CM | POA: Diagnosis not present

## 2022-01-29 DIAGNOSIS — M5451 Vertebrogenic low back pain: Secondary | ICD-10-CM | POA: Diagnosis not present

## 2022-02-01 DIAGNOSIS — G4733 Obstructive sleep apnea (adult) (pediatric): Secondary | ICD-10-CM | POA: Diagnosis not present

## 2022-02-02 ENCOUNTER — Ambulatory Visit: Payer: 59 | Admitting: Cardiovascular Disease

## 2022-02-02 ENCOUNTER — Telehealth: Payer: Self-pay | Admitting: Cardiovascular Disease

## 2022-02-02 ENCOUNTER — Ambulatory Visit: Payer: Self-pay | Admitting: Orthopedic Surgery

## 2022-02-02 ENCOUNTER — Telehealth: Payer: Self-pay

## 2022-02-02 NOTE — Telephone Encounter (Signed)
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  Recent office note noted at least 2 episode of chest discomfort.  I spoke with the patient, since the last time she saw Dr. Gwenlyn Found earlier this month, she has not had any further chest discomfort.  She has no problem accomplishing more than 4 METS of activity.  We will defer to MD to see if the patient is cleared for upcoming spinal stimulator battery change.  Dr. Gwenlyn Found, please forward your response to P CV DIV PREOP

## 2022-02-02 NOTE — Telephone Encounter (Signed)
-  Pt called to inform us her surgery date has been moved up to 02/15/22. -Will forward to pre-op department to make aware.

## 2022-02-02 NOTE — Telephone Encounter (Signed)
Pt states that she dropped off Medical Clearance paperwork on 01/29/22 and was calling to check the status on it. Pt states that her surgery is scheduled for 02/19/22 and needs the paperwork back a week prior. Please advise

## 2022-02-02 NOTE — Telephone Encounter (Signed)
   Pre-operative Risk Assessment    Patient Name: Alexis Black  DOB: [birth year] MRN: [id number]      Request for Surgical Clearance    Procedure:   SPINAL CORD STIMULATOR BATTERY REPLACEMENT  Date of Surgery:  Clearance 02/19/22                                 Surgeon:  DR. DAHARI BROOKS Surgeon's Group or Practice Name:  [facility] Phone number:  [phone] Fax number:  [phone] SHERRY WILLIS   Type of Clearance Requested:   - Medical    Type of Anesthesia:  Not Indicated   Additional requests/questions:    Signed, Zebedee Iba   02/02/2022, 9:20 AM

## 2022-02-08 DIAGNOSIS — Z794 Long term (current) use of insulin: Secondary | ICD-10-CM | POA: Diagnosis not present

## 2022-02-08 DIAGNOSIS — E1129 Type 2 diabetes mellitus with other diabetic kidney complication: Secondary | ICD-10-CM | POA: Diagnosis not present

## 2022-02-08 DIAGNOSIS — R809 Proteinuria, unspecified: Secondary | ICD-10-CM | POA: Diagnosis not present

## 2022-02-08 NOTE — Telephone Encounter (Signed)
Patient is calling to check on the status of this clearance due to the requesting office needing the clearance back today.

## 2022-02-08 NOTE — Telephone Encounter (Signed)
    Patient Name: Alexis Black  DOB: [birth year] MRN: [id number]  Primary Cardiologist: None  Chart reviewed as part of pre-operative protocol coverage. Given past medical history and time since last visit, based on ACC/AHA guidelines, Alexis Black would be at acceptable risk for the planned procedure without further cardiovascular testing.   Per Dr. Gwenlyn Found, "Rare CP, none recently with 1 VD and nl LV FXN. Cleared for spinal implant"  The patient was advised that if she develops new symptoms prior to surgery to contact our office to arrange for a follow-up visit, and she verbalized understanding.  I will route this recommendation to the requesting party via Epic fax function and remove from pre-op pool.  Please call with questions.  [place], [place] 02/08/2022, 12:48 PM

## 2022-02-08 NOTE — Telephone Encounter (Signed)
I will forward this to pre op pool to review notes from MD that he has cleared the pt.

## 2022-02-09 ENCOUNTER — Other Ambulatory Visit (HOSPITAL_COMMUNITY): Payer: Self-pay

## 2022-02-10 ENCOUNTER — Other Ambulatory Visit (HOSPITAL_COMMUNITY): Payer: Self-pay

## 2022-02-10 MED ORDER — MOUNJARO 10 MG/0.5ML ~~LOC~~ SOAJ
10.0000 mg | SUBCUTANEOUS | 3 refills | Status: DC
  Filled 2022-02-10: qty 2, 28d supply, fill #0
  Filled 2022-02-24 – 2022-03-07 (×2): qty 2, 28d supply, fill #1
  Filled 2022-04-01: qty 2, 28d supply, fill #2
  Filled 2022-04-30: qty 2, 28d supply, fill #3

## 2022-02-11 ENCOUNTER — Encounter (HOSPITAL_COMMUNITY): Payer: Self-pay | Admitting: Orthopedic Surgery

## 2022-02-11 NOTE — Progress Notes (Signed)
PCP - Donnajean Lopes, MD Cardiologist - Dr. Gwenlyn Found EKG - 12/29/21 Chest x-ray -  ECHO - 01/13/22 Cardiac Cath -  CPAP - yes Fasting Blood Sugar:  90-190 Checks Blood Sugar:  multiple times/day - freestyle libre cont. CBG monitor Blood Thinner Instructions:  Aspirin Instructions:  ERAS Protcol -  COVID TEST- n/a  Anesthesia review: n/a  -------------  SDW INSTRUCTIONS:  Your procedure is scheduled on Monday 6/12. Please report to [facility] Main Entrance "A" at 0530 A.M., and check in at the Admitting office. Call this number if you have problems the morning of surgery: [phone]   Remember: Do not eat or drink after midnight the night before your surgery   Medications to take morning of surgery with a sip of water include: amLODipine (NORVASC)  escitalopram (LEXAPRO) RABEprazole (ACIPHEX)   As of today, STOP taking any Aspirin (unless otherwise instructed by your surgeon), Aleve, Naproxen, Ibuprofen, Motrin, Advil, Goody's, BC's, all herbal medications, fish oil, and all vitamins.    The Morning of Surgery Do not wear jewelry, make-up or nail polish. Do not wear lotions, powders, or perfumes or deodorant Do not bring valuables to the hospital. [facility] is not responsible for any belongings or valuables.  If you are a smoker, DO NOT Smoke 24 hours prior to surgery  If you wear a CPAP at night please bring your mask the morning of surgery   Remember that you must have someone to transport you home after your surgery, and remain with you for 24 hours if you are discharged the same day.  Please bring cases for contacts, glasses, hearing aids, dentures or bridgework because it cannot be worn into surgery.   Patients discharged the day of surgery will not be allowed to drive home.   Please shower the NIGHT BEFORE/MORNING OF SURGERY (use antibacterial soap like DIAL soap if possible). Wear comfortable clothes the morning of surgery. Oral Hygiene is also important to  reduce your risk of infection.  Remember - BRUSH YOUR TEETH THE MORNING OF SURGERY WITH YOUR REGULAR TOOTHPASTE  Patient denies shortness of breath, fever, cough and chest pain.

## 2022-02-15 ENCOUNTER — Other Ambulatory Visit: Payer: Self-pay

## 2022-02-15 ENCOUNTER — Ambulatory Visit (HOSPITAL_COMMUNITY): Payer: 59 | Admitting: Certified Registered Nurse Anesthetist

## 2022-02-15 ENCOUNTER — Ambulatory Visit (HOSPITAL_COMMUNITY)
Admission: RE | Admit: 2022-02-15 | Discharge: 2022-02-15 | Disposition: A | Discharge status: Home / Self Care | Payer: 59 | Source: Ambulatory Visit | Attending: Orthopedic Surgery | Admitting: Orthopedic Surgery

## 2022-02-15 ENCOUNTER — Encounter (HOSPITAL_COMMUNITY)
Admission: RE | Disposition: A | Discharge status: Home / Self Care | Payer: Self-pay | Source: Ambulatory Visit | Attending: Orthopedic Surgery

## 2022-02-15 ENCOUNTER — Ambulatory Visit (HOSPITAL_BASED_OUTPATIENT_CLINIC_OR_DEPARTMENT_OTHER): Payer: 59 | Admitting: Certified Registered Nurse Anesthetist

## 2022-02-15 ENCOUNTER — Encounter (HOSPITAL_COMMUNITY): Payer: Self-pay | Admitting: Orthopedic Surgery

## 2022-02-15 DIAGNOSIS — M545 Low back pain, unspecified: Secondary | ICD-10-CM | POA: Insufficient documentation

## 2022-02-15 DIAGNOSIS — R0602 Shortness of breath: Secondary | ICD-10-CM | POA: Diagnosis not present

## 2022-02-15 DIAGNOSIS — T85193A Other mechanical complication of implanted electronic neurostimulator, generator, initial encounter: Secondary | ICD-10-CM | POA: Diagnosis not present

## 2022-02-15 DIAGNOSIS — N289 Disorder of kidney and ureter, unspecified: Secondary | ICD-10-CM | POA: Diagnosis not present

## 2022-02-15 DIAGNOSIS — D649 Anemia, unspecified: Secondary | ICD-10-CM | POA: Diagnosis not present

## 2022-02-15 DIAGNOSIS — E119 Type 2 diabetes mellitus without complications: Secondary | ICD-10-CM | POA: Insufficient documentation

## 2022-02-15 DIAGNOSIS — Z9989 Dependence on other enabling machines and devices: Secondary | ICD-10-CM

## 2022-02-15 DIAGNOSIS — G4730 Sleep apnea, unspecified: Secondary | ICD-10-CM | POA: Insufficient documentation

## 2022-02-15 DIAGNOSIS — D759 Disease of blood and blood-forming organs, unspecified: Secondary | ICD-10-CM | POA: Diagnosis not present

## 2022-02-15 DIAGNOSIS — K449 Diaphragmatic hernia without obstruction or gangrene: Secondary | ICD-10-CM | POA: Diagnosis not present

## 2022-02-15 DIAGNOSIS — Z4542 Encounter for adjustment and management of neuropacemaker (brain) (peripheral nerve) (spinal cord): Secondary | ICD-10-CM | POA: Insufficient documentation

## 2022-02-15 DIAGNOSIS — K219 Gastro-esophageal reflux disease without esophagitis: Secondary | ICD-10-CM | POA: Insufficient documentation

## 2022-02-15 DIAGNOSIS — G4733 Obstructive sleep apnea (adult) (pediatric): Secondary | ICD-10-CM | POA: Diagnosis not present

## 2022-02-15 DIAGNOSIS — I499 Cardiac arrhythmia, unspecified: Secondary | ICD-10-CM | POA: Insufficient documentation

## 2022-02-15 HISTORY — PX: SPINAL CORD STIMULATOR BATTERY EXCHANGE: SHX6202

## 2022-02-15 LAB — BASIC METABOLIC PANEL
Anion gap: 11 (ref 5–15)
BUN: 28 mg/dL — ABNORMAL HIGH (ref 6–20)
CO2: 22 mmol/L (ref 22–32)
Calcium: 9 mg/dL (ref 8.9–10.3)
Chloride: 105 mmol/L (ref 98–111)
Creatinine, Ser: 1.32 mg/dL — ABNORMAL HIGH (ref 0.44–1.00)
GFR, Estimated: 47 mL/min — ABNORMAL LOW (ref >60)
Glucose, Bld: 128 mg/dL — ABNORMAL HIGH (ref 70–99)
Potassium: 3.9 mmol/L (ref 3.5–5.1)
Sodium: 138 mmol/L (ref 135–145)

## 2022-02-15 LAB — CBC
HCT: 33.6 % — ABNORMAL LOW (ref 36.0–46.0)
Hemoglobin: 11.2 g/dL — ABNORMAL LOW (ref 12.0–15.0)
MCH: 29.1 pg (ref 26.0–34.0)
MCHC: 33.3 g/dL (ref 30.0–36.0)
MCV: 87.3 fL (ref 80.0–100.0)
Platelets: 253 10*3/uL (ref 150–400)
RBC: 3.85 MIL/uL — ABNORMAL LOW (ref 3.87–5.11)
RDW: 13.3 % (ref 11.5–15.5)
WBC: 7.2 10*3/uL (ref 4.0–10.5)
nRBC: 0 % (ref 0.0–0.2)

## 2022-02-15 LAB — GLUCOSE, CAPILLARY
Glucose-Capillary: 127 mg/dL — ABNORMAL HIGH (ref 70–99)
Glucose-Capillary: 154 mg/dL — ABNORMAL HIGH (ref 70–99)

## 2022-02-15 SURGERY — SPINAL CORD STIMULATOR BATTERY EXCHANGE
Anesthesia: General

## 2022-02-15 MED ORDER — FENTANYL CITRATE (PF) 250 MCG/5ML IJ SOLN
INTRAMUSCULAR | Status: AC
  Filled 2022-02-15: qty 5

## 2022-02-15 MED ORDER — INSULIN ASPART 100 UNIT/ML IJ SOLN: 0.0000 [IU] | INTRAMUSCULAR | Status: DC | PRN

## 2022-02-15 MED ORDER — ONDANSETRON HCL 4 MG/2ML IJ SOLN
INTRAMUSCULAR | Status: AC
Start: 2022-02-15 — End: ?
  Filled 2022-02-15: qty 2

## 2022-02-15 MED ORDER — SCOPOLAMINE 1 MG/3DAYS TD PT72
MEDICATED_PATCH | TRANSDERMAL | Status: DC | PRN
  Administered 2022-02-15: 1 via TRANSDERMAL

## 2022-02-15 MED ORDER — BUPIVACAINE-EPINEPHRINE (PF) 0.25% -1:200000 IJ SOLN
INTRAMUSCULAR | Status: DC | PRN
  Administered 2022-02-15: 10 mL

## 2022-02-15 MED ORDER — LIDOCAINE 2% (20 MG/ML) 5 ML SYRINGE
INTRAMUSCULAR | Status: DC | PRN
  Administered 2022-02-15: 60 mg via INTRAVENOUS

## 2022-02-15 MED ORDER — PROPOFOL 10 MG/ML IV BOLUS
INTRAVENOUS | Status: AC
  Filled 2022-02-15: qty 20

## 2022-02-15 MED ORDER — CHLORHEXIDINE GLUCONATE 0.12 % MT SOLN: 15.0000 mL | Freq: Once | OROMUCOSAL | Status: AC

## 2022-02-15 MED ORDER — ROCURONIUM BROMIDE 10 MG/ML (PF) SYRINGE
PREFILLED_SYRINGE | INTRAVENOUS | Status: DC | PRN
  Administered 2022-02-15: 70 mg via INTRAVENOUS

## 2022-02-15 MED ORDER — LACTATED RINGERS IV SOLN: INTRAVENOUS | Status: DC

## 2022-02-15 MED ORDER — METHOCARBAMOL 500 MG PO TABS: 500.0000 mg | ORAL_TABLET | Freq: Three times a day (TID) | ORAL | 0 refills | Status: AC | PRN

## 2022-02-15 MED ORDER — ACETAMINOPHEN 10 MG/ML IV SOLN
INTRAVENOUS | Status: AC
  Filled 2022-02-15: qty 100

## 2022-02-15 MED ORDER — DEXAMETHASONE SODIUM PHOSPHATE 10 MG/ML IJ SOLN
INTRAMUSCULAR | Status: DC | PRN
  Administered 2022-02-15: 4 mg via INTRAVENOUS

## 2022-02-15 MED ORDER — 0.9 % SODIUM CHLORIDE (POUR BTL) OPTIME
TOPICAL | Status: DC | PRN
  Administered 2022-02-15: 1000 mL

## 2022-02-15 MED ORDER — PROPOFOL 10 MG/ML IV BOLUS
INTRAVENOUS | Status: DC | PRN
  Administered 2022-02-15: 140 mg via INTRAVENOUS

## 2022-02-15 MED ORDER — BUPIVACAINE-EPINEPHRINE (PF) 0.25% -1:200000 IJ SOLN
INTRAMUSCULAR | Status: AC
  Filled 2022-02-15: qty 30

## 2022-02-15 MED ORDER — CHLORHEXIDINE GLUCONATE 0.12 % MT SOLN
OROMUCOSAL | Status: AC
  Administered 2022-02-15: 15 mL via OROMUCOSAL
  Filled 2022-02-15: qty 15

## 2022-02-15 MED ORDER — FENTANYL CITRATE (PF) 250 MCG/5ML IJ SOLN
INTRAMUSCULAR | Status: DC | PRN
Start: 2022-02-15 — End: 2022-02-15
  Administered 2022-02-15: 100 ug via INTRAVENOUS

## 2022-02-15 MED ORDER — OXYCODONE-ACETAMINOPHEN 10-325 MG PO TABS: 1.0000 | ORAL_TABLET | Freq: Four times a day (QID) | ORAL | 0 refills | Status: AC | PRN

## 2022-02-15 MED ORDER — BUPIVACAINE HCL 0.5 % IJ SOLN
INTRAMUSCULAR | Status: AC
  Filled 2022-02-15: qty 1

## 2022-02-15 MED ORDER — ORAL CARE MOUTH RINSE
15.0000 mL | Freq: Once | OROMUCOSAL | Status: AC
Start: 2022-02-15 — End: 2022-02-15

## 2022-02-15 MED ORDER — ONDANSETRON HCL 4 MG/2ML IJ SOLN
INTRAMUSCULAR | Status: DC | PRN
  Administered 2022-02-15: 4 mg via INTRAVENOUS

## 2022-02-15 MED ORDER — MIDAZOLAM HCL 2 MG/2ML IJ SOLN
INTRAMUSCULAR | Status: DC | PRN
  Administered 2022-02-15: 2 mg via INTRAVENOUS

## 2022-02-15 MED ORDER — MIDAZOLAM HCL 2 MG/2ML IJ SOLN
INTRAMUSCULAR | Status: AC
  Filled 2022-02-15: qty 2

## 2022-02-15 MED ORDER — ACETAMINOPHEN 10 MG/ML IV SOLN
INTRAVENOUS | Status: DC | PRN
  Administered 2022-02-15: 1000 mg via INTRAVENOUS

## 2022-02-15 MED ORDER — BUPIVACAINE LIPOSOME 1.3 % IJ SUSP
INTRAMUSCULAR | Status: AC
  Filled 2022-02-15: qty 20

## 2022-02-15 MED ORDER — SUGAMMADEX SODIUM 200 MG/2ML IV SOLN
INTRAVENOUS | Status: DC | PRN
  Administered 2022-02-15: 400 mg via INTRAVENOUS

## 2022-02-15 MED ORDER — PHENYLEPHRINE 80 MCG/ML (10ML) SYRINGE FOR IV PUSH (FOR BLOOD PRESSURE SUPPORT)
PREFILLED_SYRINGE | INTRAVENOUS | Status: DC | PRN
  Administered 2022-02-15 (×2): 80 ug via INTRAVENOUS

## 2022-02-15 MED ORDER — BUPIVACAINE HCL (PF) 0.25 % IJ SOLN
INTRAMUSCULAR | Status: AC
  Filled 2022-02-15: qty 30

## 2022-02-15 MED ORDER — CEFAZOLIN SODIUM-DEXTROSE 2-4 GM/100ML-% IV SOLN
2.0000 g | INTRAVENOUS | Status: AC
  Administered 2022-02-15: 2 g via INTRAVENOUS
  Filled 2022-02-15: qty 100

## 2022-02-15 MED ORDER — ONDANSETRON HCL 4 MG PO TABS: 4.0000 mg | ORAL_TABLET | Freq: Three times a day (TID) | ORAL | 0 refills | Status: AC | PRN

## 2022-02-15 SURGICAL SUPPLY — 59 items
BAG COUNTER SPONGE SURGICOUNT (BAG) ×2 IMPLANT
BAG SPNG CNTER NS LX DISP (BAG) ×1
CANISTER SUCT 3000ML PPV (MISCELLANEOUS) ×2 IMPLANT
CHARGER BATTERY NEUROSTIM ABT (NEUROSURGERY SUPPLIES) ×1 IMPLANT
CLSR STERI-STRIP ANTIMIC 1/2X4 (GAUZE/BANDAGES/DRESSINGS) ×2 IMPLANT
CONTROLLER NEUROSTIM PATIENT (NEUROSURGERY SUPPLIES) ×1 IMPLANT
CORD BIPOLAR FORCEPS 12FT (ELECTRODE) ×2 IMPLANT
DRAIN CHANNEL 15F RND FF W/TCR (WOUND CARE) IMPLANT
DRAPE INCISE IOBAN 66X45 STRL (DRAPES) ×2 IMPLANT
DRAPE LAPAROTOMY 100X72 PEDS (DRAPES) ×2 IMPLANT
DRAPE POUCH INSTRU U-SHP 10X18 (DRAPES) ×2 IMPLANT
DRAPE SURG 17X23 STRL (DRAPES) ×2 IMPLANT
DRAPE U-SHAPE 47X51 STRL (DRAPES) ×2 IMPLANT
DRSG AQUACEL AG ADV 3.5X 6 (GAUZE/BANDAGES/DRESSINGS) ×2 IMPLANT
DRSG OPSITE POSTOP 4X6 (GAUZE/BANDAGES/DRESSINGS) ×2 IMPLANT
DURAPREP 26ML APPLICATOR (WOUND CARE) ×2 IMPLANT
ELECT CAUTERY BLADE 6.4 (BLADE) ×2 IMPLANT
ELECT PENCIL ROCKER SW 15FT (MISCELLANEOUS) ×2 IMPLANT
ELECT REM PT RETURN 9FT ADLT (ELECTROSURGICAL) ×2
ELECTRODE REM PT RTRN 9FT ADLT (ELECTROSURGICAL) ×1 IMPLANT
GENERATOR NEUROSTIM ETERNA 16 (Generator) ×1 IMPLANT
GLOVE BIO SURGEON STRL SZ 6.5 (GLOVE) ×1 IMPLANT
GLOVE BIOGEL PI IND STRL 6.5 (GLOVE) ×1 IMPLANT
GLOVE BIOGEL PI IND STRL 7.0 (GLOVE) IMPLANT
GLOVE BIOGEL PI IND STRL 8.5 (GLOVE) ×1 IMPLANT
GLOVE BIOGEL PI INDICATOR 6.5 (GLOVE)
GLOVE BIOGEL PI INDICATOR 7.0 (GLOVE) ×1
GLOVE BIOGEL PI INDICATOR 8.5 (GLOVE) ×1
GLOVE SS BIOGEL STRL SZ 7 (GLOVE) IMPLANT
GLOVE SS BIOGEL STRL SZ 8.5 (GLOVE) ×2 IMPLANT
GLOVE SUPERSENSE BIOGEL SZ 7 (GLOVE) ×1
GLOVE SUPERSENSE BIOGEL SZ 8.5 (GLOVE) ×2
GOWN STRL REUS W/ TWL LRG LVL3 (GOWN DISPOSABLE) ×2 IMPLANT
GOWN STRL REUS W/TWL 2XL LVL3 (GOWN DISPOSABLE) ×2 IMPLANT
GOWN STRL REUS W/TWL LRG LVL3 (GOWN DISPOSABLE) ×2
KIT BASIN OR (CUSTOM PROCEDURE TRAY) ×2 IMPLANT
KIT CHARGER NEUROSTIM ABT (NEUROSURGERY SUPPLIES) ×1 IMPLANT
KIT TURNOVER KIT B (KITS) ×2 IMPLANT
MAGNET NEUROSTIM ETERNA PAT (NEUROSURGERY SUPPLIES) ×1 IMPLANT
MANUAL PATIENT NEUROSTIM DISP (MISCELLANEOUS) ×1 IMPLANT
NDL SUT 6 .5 CRC .975X.05 MAYO (NEEDLE) ×1 IMPLANT
NEEDLE 22X1 1/2 (OR ONLY) (NEEDLE) IMPLANT
NEEDLE MAYO TAPER (NEEDLE) ×2
NS IRRIG 1000ML POUR BTL (IV SOLUTION) ×2 IMPLANT
PACK GENERAL/GYN (CUSTOM PROCEDURE TRAY) ×2 IMPLANT
PACK UNIVERSAL I (CUSTOM PROCEDURE TRAY) ×2 IMPLANT
PAD ARMBOARD 7.5X6 YLW CONV (MISCELLANEOUS) ×4 IMPLANT
SPONGE T-LAP 4X18 ~~LOC~~+RFID (SPONGE) ×1 IMPLANT
STAPLER VISISTAT 35W (STAPLE) IMPLANT
SUT ETHIBOND NAB CT1 #1 30IN (SUTURE) ×2 IMPLANT
SUT MNCRL AB 3-0 PS2 27 (SUTURE) ×3 IMPLANT
SUT VIC AB 1 CT1 18XCR BRD 8 (SUTURE) ×1 IMPLANT
SUT VIC AB 1 CT1 8-18 (SUTURE) ×2
SUT VIC AB 2-0 CT1 18 (SUTURE) ×3 IMPLANT
SYR BULB IRRIG 60ML STRL (SYRINGE) ×2 IMPLANT
SYR CONTROL 10ML LL (SYRINGE) IMPLANT
TOWEL GREEN STERILE (TOWEL DISPOSABLE) ×2 IMPLANT
TOWEL GREEN STERILE FF (TOWEL DISPOSABLE) ×4 IMPLANT
WATER STERILE IRR 1000ML POUR (IV SOLUTION) ×2 IMPLANT

## 2022-02-15 NOTE — Op Note (Signed)
OPERATIVE REPORT  DATE OF SURGERY: 02/15/2022  PATIENT NAME:  Alexis Black MRN: [id number] DOB: 11/10/1960  PCP: Donnajean Lopes, MD  PRE-OPERATIVE DIAGNOSIS: Spinal cord stimulator battery failure  POST-OPERATIVE DIAGNOSIS: Same  PROCEDURE:   Replacement of spinal cord stimulator battery  SURGEON:  Melina Schools, MD  PHYSICIAN ASSISTANT: None  ANESTHESIA:   General  EBL: 10 ml   Complications: None  Implants: Abbott spine: Eterna pulse generator  BRIEF HISTORY: [name] is a 61 y.o. female who presents to my care several years after having a spinal cord stimulator placed.  The battery was no longer functioning and her chronic back pain was increasing.  As result of the prior successful spinal cord stimulator in the current battery failure we elected to move forward with replacement.  All risks/benefits/alternatives were discussed with the patient and consent was obtained.  PROCEDURE DETAILS: Patient was brought into the operating room and was properly positioned on the operating room table.  After induction with general anesthesia the patient was endotracheally intubated.  A timeout was taken to confirm all important data: including patient, procedure, and the level. Teds, SCD's were applied.   Patient was turned prone onto the Wilson frame and all bony prominences well-padded.  The area over this spinal cord stimulator battery was prepped and draped in a standard fashion.  The previous incision was reincised and I sharply dissected down to the pulse generator.  I then incised the capsule that had formed around the pulse generator and released it and then delivered the generator out of the wound.  The leads were noted to be intact.  The new battery was obtained and the leads were secured into the battery and tightened according manufacture standards.  The battery site was irrigated copiously with normal saline and the excess lead was packed on the undersurface of the battery  and the battery was placed in the pocket.  2 number one 6 Vicryl sutures were used to secure the battery into the pocket.  The battery was then tested by the representative and it was functioning.  The deep fascia was then reapproximated with interrupted #1 Vicryl suture.  Then a layer 2-0 Vicryl suture, and finally 3-0 Monocryl for the skin.  Postoperative analgesia I injected quarter percent Marcaine with Exparel.  Once the wound has adequate analgesia from local medication Steri-Strips and a dry dressing were applied.  Patient was ultimately extubated and transferred the PACU without incident.  The end of the case all needle sponge counts were correct.  There were no adverse intraoperative events.  Melina Schools, MD 02/15/2022 8:21 AM

## 2022-02-15 NOTE — H&P (Signed)
History: Alexis Black presents today for preoperative evaluation for replacement of her spinal cord stimulator battery. Patient has had successful use of her spinal cord stimulator for several years now but unfortunately the battery is no longer functioning. It was recently interrogated by the representative and was noted to be at the end of life. As result of the battery being at end-of-life the patient returns to see me to have a replacement.  Past Medical History:  Diagnosis Date   Deafness in left ear    DM Type 2    Family history of adverse reaction to anesthesia    mother - PONV   GERD (gastroesophageal reflux disease)    Heart palpitations    "with caffeine" occ has   History of bronchitis    last time 02-2021   History of COVID-19 02/2021   bronchitis symptoms x 4 to 5 days all symptoms resolved   History of hiatal hernia    Numbness and tingling of leg    burning both feet @ times   PONV (postoperative nausea and vomiting)    S/P insertion of spinal cord stimulator    Shortness of breath dyspnea    "with caffiene"    Sleep apnea    "diagnosed over 10 years ago but I don't wear a CPAP per pt on 09-16-2021   Wears glasses    Wears hearing aid in left ear 09/16/2021   sometimes wears  per pt    Allergies  Allergen Reactions   Actos [Pioglitazone] Other (See Comments)    bloated   Crestor [Rosuvastatin]     myalgias   Pravachol [Pravastatin]     Other reaction(s): had muscle cramps   Quinapril Hcl     Other reaction(s): Unknown   Trulicity [Dulaglutide]     Other reaction(s): nausea with trulicity    No current facility-administered medications on file prior to encounter.   Current Outpatient Medications on File Prior to Encounter  Medication Sig Dispense Refill   Alirocumab 75 MG/ML SOAJ Inject 75 mg into the skin every 14 (fourteen) days. 2 mL 11   amLODipine (NORVASC) 5 MG tablet Take 5 mg by mouth daily.  12   Coenzyme Q10 100 MG capsule Take 100 mg by  mouth daily.     dapagliflozin propanediol (FARXIGA) 5 MG TABS tablet Take 5 mg by mouth daily.     escitalopram (LEXAPRO) 5 MG tablet Take 5 mg by mouth daily.     gabapentin (NEURONTIN) 300 MG capsule Take 300 mg by mouth at bedtime.     metFORMIN (GLUCOPHAGE) 500 MG tablet Take 1,000 mg by mouth 2 (two) times daily.     methylphenidate (METADATE CD) 40 MG CR capsule Take 40 mg by mouth every morning.     metoprolol succinate (TOPROL-XL) 50 MG 24 hr tablet Take 50 mg by mouth at bedtime.     Multiple Vitamin (MULTIVITAMIN) tablet Take 1 tablet by mouth daily.     Omega-3 1000 MG CAPS Take 1,000 mg by mouth daily.     progesterone (PROMETRIUM) 100 MG capsule Take 100 mg by mouth at bedtime.  12   RABEprazole (ACIPHEX) 20 MG tablet Take 20 mg by mouth daily.     telmisartan-hydrochlorothiazide (MICARDIS HCT) 80-12.5 MG tablet Take 1 tablet by mouth daily.     Continuous Blood Gluc Sensor (FREESTYLE LIBRE 2 SENSOR) MISC by Does not apply route.     tirzepatide (MOUNJARO) 5 MG/0.5ML Pen Inject 5 mg into  the skin once a week. (Patient not taking: Reported on 02/10/2022) 2 mL 11    Physical Exam: Vitals:   02/15/22 0542  BP: 123/72  Pulse: 66  Resp: 17  Temp: 98.1 F (36.7 C)  SpO2: 100%   Body mass index is 27.25 kg/m. Alexis Black is a pleasant individual, who appears younger than their stated age.  She is alert and orientated 3.  No shortness of breath, chest pain.  Abdomen is soft and non-tender, negative loss of bowel and bladder control, no rebound tenderness.  Negative: skin lesions abrasions contusions  Peripheral pulses: 2+ dorsalis pedis/posterior tibialis pulses bilaterally. LE compartments are: Soft and nontender.  Gait pattern: Normal  Assistive devices: None  Neuro: 5/5 motor strength in lower extremity bilaterally. Negative Babinski test, no clonus, intermittent dysesthesias into the lower extremity. Negative nerve root tension signs.  Musculoskeletal: Moderate low  back pain. Well-healed surgical scar from previous spinal cord stimulator implant. No significant pain over the battery site. No SI joint pain.   A/P: Summary: Alexis Black presents today for preoperative evaluation for replacement of her spinal cord stimulator battery. Patient has had successful use of her spinal cord stimulator for several years now but unfortunately the battery is no longer functioning. It was recently interrogated by the representative and was noted to be at the end of life. As result of the battery being at end-of-life the patient returns to see me to have a replacement.  Risks and benefits of surgery were discussed with the patient. These include: Infection, bleeding, death, stroke, paralysis, ongoing or worse pain, need for additional surgery, Failure of the battery requiring reoperation. Damage injury to the leads requiring the surgery to be aborted. Migration of the lead, failure to obtain similar results.  All of her questions were addressed. We will plan on moving forward with a spinal cord stimulator impulse battery exchange in the near future.

## 2022-02-15 NOTE — Brief Op Note (Signed)
02/15/2022  8:26 AM  PATIENT:  Alexis Black  61 y.o. female  PRE-OPERATIVE DIAGNOSIS:  End of Life spinal cord stimulator battery  POST-OPERATIVE DIAGNOSIS:  End of Life spinal cord stimulator battery  PROCEDURE:  Procedure(s): SPINAL CORD STIMULATOR BATTERY EXCHANGE (N/A)  SURGEON:  Surgeon(s) and Role:    Melina Schools, MD - Primary  PHYSICIAN ASSISTANT:   ASSISTANTS: none   ANESTHESIA:   general  EBL:  10 mL   BLOOD ADMINISTERED:none  DRAINS: none   LOCAL MEDICATIONS USED:  MARCAINE    and OTHER exparel  SPECIMEN:  No Specimen  DISPOSITION OF SPECIMEN:  N/A  COUNTS:  YES  TOURNIQUET:  * No tourniquets in log *  DICTATION: .Dragon Dictation  PLAN OF CARE: Discharge to home after PACU  PATIENT DISPOSITION:  PACU - hemodynamically stable.

## 2022-02-15 NOTE — Anesthesia Procedure Notes (Signed)
Procedure Name: Intubation Date/Time: 02/15/2022 7:40 AM  Performed by: Inda Coke, CRNAPre-anesthesia Checklist: Patient identified, Emergency Drugs available, Suction available, Timeout performed and Patient being monitored Patient Re-evaluated:Patient Re-evaluated prior to induction Oxygen Delivery Method: Circle system utilized Preoxygenation: Pre-oxygenation with 100% oxygen Induction Type: IV induction Ventilation: Mask ventilation without difficulty Laryngoscope Size: Mac and 3 Grade View: Grade I Tube type: Oral Airway Equipment and Method: Stylet Placement Confirmation: ETT inserted through vocal cords under direct vision, positive ETCO2, CO2 detector and breath sounds checked- equal and bilateral Secured at: 22 cm Tube secured with: Tape Dental Injury: Teeth and Oropharynx as per pre-operative assessment

## 2022-02-15 NOTE — Transfer of Care (Signed)
Immediate Anesthesia Transfer of Care Note  Patient: Alexis Black  Procedure(s) Performed: SPINAL CORD STIMULATOR BATTERY EXCHANGE  Patient Location: PACU  Anesthesia Type:General  Level of Consciousness: awake and alert   Airway & Oxygen Therapy: Patient Spontanous Breathing  Post-op Assessment: Report given to RN and Post -op Vital signs reviewed and stable  Post vital signs: Reviewed and stable  Last Vitals:  Vitals Value Taken Time  BP 126/72 02/15/22 0834  Temp    Pulse 73 02/15/22 0836  Resp 21 02/15/22 0836  SpO2 92 % 02/15/22 0836  Vitals shown include unvalidated device data.  Last Pain:  Vitals:   02/15/22 0614  TempSrc:   PainSc: 0-No pain         Complications: No notable events documented.

## 2022-02-15 NOTE — Discharge Instructions (Signed)
 Spinal Cord Stimulator Replacement Care After This sheet gives you information about how to care for yourself after your procedure. Your health care provider may also give you more specific instructions. If you have problems or questions, contact your health care provider. What can I expect after the procedure? After the procedure, it is common to have: Soreness or pain. Some swelling in the area where the hardware was removed. A small amount of blood or clear fluid coming from your incision. Follow these instructions at home: If you have a cast: Do not stick anything inside the cast to scratch your skin. Doing that increases your risk of infection. Check the skin around the cast every day. Tell your health care provider about any concerns. You may put lotion on dry skin around the edges of the cast. Do not put lotion on the skin underneath the cast. Keep the cast clean and dry. Do not take baths, swim, or use a hot tub until your health care provider approves. Ask your health care provider if you may take showers. You may only be allowed to take sponge baths. Keep the bandage (dressing) dry until your health care provider says it can be removed. Ok to shower in 5 days.    Incision care  Follow instructions from your health care provider about how to take care of your incision. Make sure you: Wash your hands with soap and water before you change your dressing. If soap and water are not available, use hand sanitizer. Change your dressing as told by your health care provider. Leave stitches (sutures), skin glue, or adhesive strips in place. These skin closures may need to stay in place for 2 weeks or longer. If adhesive strip edges start to loosen and curl up, you may trim the loose edges. Do not remove adhesive strips completely unless your health care provider tells you to do that. Check your incision area every day for signs of infection. Check for: Redness. More swelling or pain. More  fluid or blood. Warmth. Pus or a bad smell. Managing pain, stiffness, and swelling  If directed, put ice on the affected area: Put ice in a plastic bag. Place a towel between your skin and the bag. Leave the ice on for 20 minutes, 2-3 times a day. Move your fingers or toes often to avoid stiffness and to lessen swelling.  Driving Do not drive or use heavy machinery while taking prescription pain medicine. Do not drive for 24 hours if you were given a medicine to help you relax (sedative) during your procedure. Ask your health care provider when it is safe to drive if you have a cast, splint, or boot on the affected limb. Activity Ask your health care provider what activities are safe for you during recovery, and ask what activities you need to avoid. Do not use the injured limb to support your body weight until your health care provider says that you can. Do not play contact sports until your health care provider approves. Do exercises as told by your health care provider. Avoid sitting for a long time without moving. Get up and move around at least every few hours. This will help prevent blood clots. General instructions Do not put pressure on any part of the cast or splint until it is fully hardened. This may take several hours. If you are taking prescription pain medicine, take actions to prevent or treat constipation. Your health care provider may recommend that you: Drink enough fluid to keep your   urine pale yellow. Eat foods that are high in fiber, such as fresh fruits and vegetables, whole grains, and beans. Limit foods that are high in fat and processed sugars, such as fried or sweet foods. Take an over-the-counter or prescription medicine for constipation. Do not use any products that contain nicotine or tobacco, such as cigarettes and e-cigarettes. These can delay bone healing after surgery. If you need help quitting, ask your health care provider. Take over-the-counter and  prescription medicines only as told by your health care provider. Keep all follow-up visits as told by your health care provider. This is important. Contact a health care provider if: You have lasting pain. You have redness around your incision. You have more swelling or pain around your incision. You have more fluid or blood coming from your incision. Your incision feels warm to the touch. You have pus or a bad smell coming from your incision. You are unable to do exercises or physical activity as told by your health care provider. Get help right away if: You have difficulty breathing. You have chest pain. You have severe pain. You have a fever or chills. You have numbness for more than 24 hours in the area where the hardware was removed. Summary After the procedure, it is common to have some pain and swelling in the area where the hardware was removed. Follow instructions from your health care provider about how to take care of your incision. Return to your normal activities as told by your health care provider. Ask your health care provider what activities are safe for you. This information is not intended to replace advice given to you by your health care provider. Make sure you discuss any questions you have with your health care provider. 

## 2022-02-15 NOTE — Anesthesia Preprocedure Evaluation (Signed)
Anesthesia Evaluation  Patient identified by MRN, date of birth, ID band Patient awake    Reviewed: Allergy & Precautions, NPO status , Patient's Chart, lab work & pertinent test results  History of Anesthesia Complications (+) PONV and history of anesthetic complications  Airway Mallampati: III  TM Distance: >3 FB Neck ROM: Full    Dental  (+) Dental Advisory Given, Teeth Intact   Pulmonary shortness of breath, sleep apnea and Continuous Positive Airway Pressure Ventilation , neg COPD, neg recent URI,    breath sounds clear to auscultation       Cardiovascular (-) hypertension(-) angina(-) Past MI + dysrhythmias  Rhythm:Regular  Heart palpatations   Neuro/Psych Spinal cord stimulator for low back pain  Neuromuscular disease    GI/Hepatic Neg liver ROS, hiatal hernia, GERD  ,  Endo/Other  diabetes, Type 2  Renal/GU Renal InsufficiencyRenal diseaseLab Results      Component                Value               Date                      CREATININE               1.20 (H)            09/21/2021                Musculoskeletal   Abdominal   Peds  Hematology  (+) Blood dyscrasia, anemia , Lab Results      Component                Value               Date                      WBC                      9.6                 09/19/2018                HGB                      11.6 (L)            09/21/2021                HCT                      34.0 (L)            09/21/2021                MCV                      85.2                09/19/2018                PLT                      326.0               09/19/2018              Anesthesia Other Findings   Reproductive/Obstetrics  Anesthesia Physical Anesthesia Plan  ASA: 3  Anesthesia Plan: General   Post-op Pain Management:    Induction: Intravenous  PONV Risk Score and Plan: 4 or greater and Ondansetron and  Dexamethasone  Airway Management Planned: LMA and Oral ETT  Additional Equipment: None  Intra-op Plan:   Post-operative Plan: Extubation in OR  Informed Consent: I have reviewed the patients History and Physical, chart, labs and discussed the procedure including the risks, benefits and alternatives for the proposed anesthesia with the patient or authorized representative who has indicated his/her understanding and acceptance.     Dental advisory given  Plan Discussed with: CRNA  Anesthesia Plan Comments:         Anesthesia Quick Evaluation

## 2022-02-15 NOTE — Progress Notes (Addendum)
Pt refuses to have finger sticks to check CBGs.  CBG checked with blood draw prior to surgery.

## 2022-02-16 NOTE — Anesthesia Postprocedure Evaluation (Signed)
Anesthesia Post Note  Patient: Alexis Black  Procedure(s) Performed: SPINAL CORD STIMULATOR BATTERY EXCHANGE     Patient location during evaluation: PACU Anesthesia Type: General Level of consciousness: awake and alert Pain management: pain level controlled Vital Signs Assessment: post-procedure vital signs reviewed and stable Respiratory status: spontaneous breathing, nonlabored ventilation, respiratory function stable and patient connected to nasal cannula oxygen Cardiovascular status: blood pressure returned to baseline and stable Postop Assessment: no apparent nausea or vomiting Anesthetic complications: no   No notable events documented.  Last Vitals:  Vitals:   02/15/22 0850 02/15/22 0905  BP: (!) 107/48 (!) 103/49  Pulse: 69   Resp: 17   Temp:  36.4 C  SpO2: (!) 89%     Last Pain:  Vitals:   02/15/22 0905  TempSrc:   PainSc: 0-No pain                 [name]

## 2022-02-17 ENCOUNTER — Encounter (HOSPITAL_COMMUNITY): Payer: Self-pay | Admitting: Orthopedic Surgery

## 2022-02-24 ENCOUNTER — Other Ambulatory Visit (HOSPITAL_COMMUNITY): Payer: Self-pay

## 2022-03-04 DIAGNOSIS — G4733 Obstructive sleep apnea (adult) (pediatric): Secondary | ICD-10-CM | POA: Diagnosis not present

## 2022-03-08 ENCOUNTER — Other Ambulatory Visit (HOSPITAL_COMMUNITY): Payer: Self-pay

## 2022-03-10 ENCOUNTER — Other Ambulatory Visit (HOSPITAL_COMMUNITY): Payer: Self-pay

## 2022-03-10 MED ORDER — MOUNJARO 7.5 MG/0.5ML ~~LOC~~ SOAJ
7.5000 mg | SUBCUTANEOUS | 3 refills | Status: DC
  Filled 2022-03-10: qty 2, 28d supply, fill #0
  Filled 2022-04-01 – 2022-04-30 (×2): qty 6, 84d supply, fill #0

## 2022-03-15 DIAGNOSIS — G4733 Obstructive sleep apnea (adult) (pediatric): Secondary | ICD-10-CM | POA: Diagnosis not present

## 2022-04-01 ENCOUNTER — Other Ambulatory Visit (HOSPITAL_COMMUNITY): Payer: Self-pay

## 2022-04-02 ENCOUNTER — Other Ambulatory Visit (HOSPITAL_COMMUNITY): Payer: Self-pay

## 2022-04-03 DIAGNOSIS — G4733 Obstructive sleep apnea (adult) (pediatric): Secondary | ICD-10-CM | POA: Diagnosis not present

## 2022-04-03 NOTE — Progress Notes (Deleted)
Cardiology Office Note:    Date:  04/03/2022   ID:  Alexis, Black 03/24/1961, MRN [id number]  PCP:  Donnajean Lopes, MD  Cardiologist:  Quay Burow, MD  Electrophysiologist:  None   Referring MD: Donnajean Lopes, MD   Chief Complaint: follow-up of CAD  History of Present Illness:    Alexis Black is a 61 y.o. female with a history of CAD noted on coronary CTA in 01/2022, hypertension, hyperlipidemia, type 2 diabetes mellitus, and GERD who is followed by Dr. Gwenlyn Found and presents today for routine follow-up of CAD.  Patient was referred to Dr. Gwenlyn Found in 12/2021 for further evaluation of 2 episodes of chest pain (one in 08/2021 and one in 10/2021). Echo and coronary CTA were ordered for further evaluation.  Coronary CTA showed a coronary calcium score of 196 (96th percentile for age and sex) and proximal to mid RCA stenosis (50-75%) with abnormal FFR of 0.97 to 0.80 suggestive of flow limitation.  Echo showed LVEF of 60-65% with normal wall motion, mild LVH, and grade 1 diastolic dysfunction as well as mild AI and borderline dilation of the aortic root measuring 36 mm. Patient was last seen by Dr. Gwenlyn Found for follow-up in 01/2022. Given no recurrent chest pain since 10/2021, decision was made to treat conservatively but there is a very low threshold to proceed with coronary angiography if she has recurrent symptoms.  Patient presents today for follow-up. ***  CAD Patient has had 2 episodes of chest pain in the last 9 months (one in 08/2021 and one in 10/2021). Coronary CTA in 01/2022 showed a coronary calcium score of 196 (96th percentile for age and sex) and proximal to mid RCA stenosis (50-75%) with abnormal FFR of 0.97 to 0.80 suggestive of flow limitation. Echo around this time showed normal LV function and wall motion. Given no recurrent chest pain since 10/2021, decision was made to treat conservatively but there is a very low threshold to proceed with coronary angiography if she has  recurrent symptoms. - *** - Patient does not appear to be on Aspirin. Will start Aspirin '81mg'$  daily.  - Intolerant to statins in the past. On Praluent.  Hypertension BP well controlled. *** - Continue current medications: Amlodipine '5mg'$  daily, Toprol-XL '50mg'$  daily, and Telmisartan-HCTZ 80-12.'5mg'$  daily.  Hyperlipidemia Last lipid panel in *** - Intolerant to statins in the past. Started on Praluent after last visit. ***  Type 2 Diabetes Mellitus Hemoglobin A1c 6.9 in 10/2021. - On Metfomrin, Farxiga, and Tirzepatide. - Management per Endocrinology (Dr. Bud Face at [facility]).    Past Medical History:  Diagnosis Date   Deafness in left ear    DM Type 2    Family history of adverse reaction to anesthesia    mother - PONV   GERD (gastroesophageal reflux disease)    Heart palpitations    "with caffeine" occ has   History of bronchitis    last time 02-2021   History of COVID-19 02/2021   bronchitis symptoms x 4 to 5 days all symptoms resolved   History of hiatal hernia    Numbness and tingling of leg    burning both feet @ times   PONV (postoperative nausea and vomiting)    S/P insertion of spinal cord stimulator    Shortness of breath dyspnea    "with caffiene"    Sleep apnea    "diagnosed over 10 years ago but I don't wear a CPAP per pt on 09-16-2021   Wears  glasses    Wears hearing aid in left ear 09/16/2021   sometimes wears  per pt    Past Surgical History:  Procedure Laterality Date   ABDOMINAL HYSTERECTOMY     with right ovary removed over 30 yrs ago per pt on 09-16-2021   BACK SURGERY     1997, 1999, 2000 lower   CARPAL TUNNEL RELEASE Right 09/21/2021   Procedure: Right Carpal Tunnel Release + Right Dequervains first dorsal compartment release;  Surgeon: [name], MD;  Location: [place];  Service: Orthopedics;  Laterality: Right;  with local anesthesia   [place]   COLONOSCOPY  2018   polyp removal fu 5  years   ESOPHAGOGASTRODUODENOSCOPY  10/2020   INNER EAR SURGERY Left 1983   left salpingo-oophorectomy     yrs ago after hysterectomy per pt on 09-28-2021   PLANTAR FASCIA SURGERY Bilateral    over 15 yrs ago per pt on 09-17-2021   ROTATOR CUFF REPAIR Bilateral    over 10 yrs ago   [place] N/A 02/15/2022   Procedure: SPINAL CORD STIMULATOR BATTERY EXCHANGE;  Surgeon: Melina Schools, MD;  Location: [place];  Service: Orthopedics;  Laterality: N/A;   SPINAL CORD STIMULATOR INSERTION N/A 05/13/2016   Procedure: LUMBAR SPINAL CORD STIMULATOR INSERTION;  Surgeon: Melina Schools, MD;  Location: [place];  Service: Orthopedics;  Laterality: N/A;   UPPER GASTROINTESTINAL ENDOSCOPY  2018    Current Medications: No outpatient medications have been marked as taking for the 04/14/22 encounter (Appointment) with Darreld Mclean, PA-C.     Allergies:   Actos [pioglitazone], Crestor [rosuvastatin], Pravachol [pravastatin], Quinapril hcl, and Trulicity [dulaglutide]   Social History   Socioeconomic History   Marital status: Married    Spouse name: Not on file   Number of children: 1   Years of education: Not on file   Highest education level: Not on file  Occupational History   Not on file  Tobacco Use   Smoking status: Never   Smokeless tobacco: Never  Vaping Use   Vaping Use: Never used  Substance and Sexual Activity   Alcohol use: No   Drug use: No   Sexual activity: Not on file  Other Topics Concern   Not on file  Social History Narrative   Not on file   Social Determinants of Health   Financial Resource Strain: Not on file  Food Insecurity: Not on file  Transportation Needs: Not on file  Physical Activity: Not on file  Stress: Not on file  Social Connections: Not on file     Family History: The patient's family history includes Diabetes in her father and mother; Heart disease in her mother. There is no history of Colon cancer, Colon polyps,  Esophageal cancer, Rectal cancer, or Stomach cancer.  ROS:   Please see the history of present illness.     EKGs/Labs/Other Studies Reviewed:    The following studies were reviewed today:  Coronary CTA 01/08/2022: Impressions: 1. Coronary calcium score of 196. This was 90 percentile for age and sex matched control. 2. Normal coronary origin with right dominance. 3. Proximal to mid RCA stenosis (50-75%) with abnormal FFR 0.97 to 0.80 suggestive of flow limitation. Otherwise mild calcified plaque in LAD and Circumflex. 4.  Recommend cardiac catheterization. _______________  Echocardiogram 01/13/2022: Impressions:  1. Left ventricular ejection fraction, by estimation, is 60 to 65%. The  left ventricle has normal function. The  left ventricle has no regional  wall motion abnormalities. There is mild asymmetric left ventricular  hypertrophy of the septal segment. Left  ventricular diastolic parameters are consistent with Grade I diastolic  dysfunction (impaired relaxation). The average left ventricular global  longitudinal strain is 22.8 %. The global longitudinal strain is normal.   2. Right ventricular systolic function is normal. The right ventricular  size is normal. There is normal pulmonary artery systolic pressure.   3. Left atrial size was mildly dilated.   4. The mitral valve is normal in structure. Trivial mitral valve  regurgitation. No evidence of mitral stenosis.   5. The aortic valve is normal in structure. Aortic valve regurgitation is  mild. No aortic stenosis is present.   6. There is borderline dilatation of the aortic root, measuring 36 mm.   7. The inferior vena cava is normal in size with greater than 50%  respiratory variability, suggesting right atrial pressure of 3 mmHg.    EKG:  EKG not ordered today.   Recent Labs: 02/15/2022: BUN 28; Creatinine, Ser 1.32; Hemoglobin 11.2; Platelets 253; Potassium 3.9; Sodium 138  Recent Lipid Panel    Component Value  Date/Time   CHOL 221 (H) 07/02/2011 0135   TRIG 320 (H) 07/02/2011 0135   HDL 38 (L) 07/02/2011 0135   CHOLHDL 5.8 07/02/2011 0135   VLDL 64 (H) 07/02/2011 0135   LDLCALC 119 (H) 07/02/2011 0135    Physical Exam:    Vital Signs: There were no vitals taken for this visit.    Wt Readings from Last 3 Encounters:  02/15/22 174 lb (78.9 kg)  01/12/22 172 lb (78 kg)  01/05/22 172 lb 9.6 oz (78.3 kg)     General: 61 y.o. female in no acute distress. HEENT: Normocephalic and atraumatic. Sclera clear. EOMs intact. Neck: Supple. No carotid bruits. No JVD. Heart: *** RRR. Distinct S1 and S2. No murmurs, gallops, or rubs. Radial and distal pedal pulses 2+ and equal bilaterally. Lungs: No increased work of breathing. Clear to ausculation bilaterally. No wheezes, rhonchi, or rales.  Abdomen: Soft, non-distended, and non-tender to palpation. Bowel sounds present in all 4 quadrants.  MSK: Normal strength and tone for age. *** Extremities: No lower extremity edema.    Skin: Warm and dry. Neuro: Alert and oriented x3. No focal deficits. Psych: Normal affect. Responds appropriately.   Assessment:    No diagnosis found.  Plan:     Disposition: Follow up in ***   Medication Adjustments/Labs and Tests Ordered: Current medicines are reviewed at length with the patient today.  Concerns regarding medicines are outlined above.  No orders of the defined types were placed in this encounter.  No orders of the defined types were placed in this encounter.   There are no Patient Instructions on file for this visit.   Signed, Darreld Mclean, PA-C  04/03/2022 4:03 PM    [facility]

## 2022-04-05 ENCOUNTER — Other Ambulatory Visit (HOSPITAL_COMMUNITY): Payer: Self-pay

## 2022-04-13 NOTE — Progress Notes (Unsigned)
Cardiology Office Note:    Date:  04/14/2022   ID:  Kentrell, Guettler 01-16-1961, MRN [id number]  PCP:  Donnajean Lopes, MD   [facility] Cardiologist:  Quay Burow, MD     Referring MD: Donnajean Lopes, MD   CC: Chest pain of uncertain etiology  History of Present Illness:    Alexis Black is a 61 y.o. female with a hx of the following:  HTN T2DM HLD Chest pain of uncertain etiology GERD  Daytime fatigue CAD Family Hx of CAD  She was admitted for 1 day in 2012 for chest pain.  Myoview stress test at that time was negative. Has had two episodes of chest pain, once in December 2022 and once in February 2023.  2D echo on 01/13/2022 revealed LVEF of 60 to 65%, mild LVH of the septal segment, grade 1 diastolic dysfunction, mild dilation of the left atria, trivial mitral valve regurgitation, mild aortic valve regurgitation with no aortic stenosis, borderline dilatation of the aortic root, measuring 36 mm, all other findings normal.  Coronary CTA in May 2023 revealed coronary calcium score 196, 96th percentile for her demographic, normal coronary origin with right dominance, proximal to mid RCA stenosis (50 to 75%) with abnormal FFR 0.97-0.80 suggestive of flow limitation, otherwise mild calcified plaque in LAD and circumflex.  It was recommended she undergo a cardiac catheterization, and no significant noncardiac abnormality identified.  She saw Dr. Gwenlyn Found back in the office on Jan 12, 2022 and discussed her coronary CTA results.  He stated at that point he was going to continue to follow her conservatively but stated his threshold will be low to recommend coronary angiography should she have recurrent symptoms.  He recommended that she start Repatha due to elevated LDL and her history of intolerance to statins.  She presents for her 65-monthfollow-up appointment for evaluation of chest pain of uncertain etiology. States she has had a recent history of chest pain  since her last visit 3 months ago. During this episode she describes it as a sharp, shooting chest pain, middle of her chest, that occurred at rest, rated it 6/10, and only lasted a couple of minutes in duration. Does admit to new tingling sensation of arms that has occured 1-2 times since she was last seen in 01/2022. Denies any recent shortness of breath, palpitations, syncope, presyncope, orthopnea, PND, swelling, acute bleeding, or claudication. Denies any chest pain or tingling arm sensation today. Says she has not tolerated statins and reports myalgias with this. Has a hx of prior statin intolerance.Also admits to not tolerating Repatha due to myalgias. States BP is elevated due to recent stress and travel to get to the clinic appointment today. Owns a BP cuff and says readings are typically normal (around 120's for SBP) at home. Does admit to daytime fatigue and takes Ritalin for this and is wondering if her recent CP episode is related to taking Ritalin, PCP is managing this medication. Says she was off Ritalin for a while and recently started back on 40 mg daily instead of 20 mg daily. Denies any other questions or concerns.   Past Medical History:  Diagnosis Date   Deafness in left ear    DM Type 2    Family history of adverse reaction to anesthesia    mother - PONV   GERD (gastroesophageal reflux disease)    Heart palpitations    "with caffeine" occ has   History of bronchitis  last time 02-2021   History of COVID-19 02/2021   bronchitis symptoms x 4 to 5 days all symptoms resolved   History of hiatal hernia    Numbness and tingling of leg    burning both feet @ times   PONV (postoperative nausea and vomiting)    S/P insertion of spinal cord stimulator    Shortness of breath dyspnea    "with caffiene"    Sleep apnea    "diagnosed over 10 years ago but I don't wear a CPAP per pt on 09-16-2021   Wears glasses    Wears hearing aid in left ear 09/16/2021   sometimes wears  per pt     Past Surgical History:  Procedure Laterality Date   ABDOMINAL HYSTERECTOMY     with right ovary removed over 30 yrs ago per pt on 09-16-2021   [place], 2000 lower   CARPAL TUNNEL RELEASE Right 09/21/2021   Procedure: Right Carpal Tunnel Release + Right Dequervains first dorsal compartment release;  Surgeon: [name], MD;  Location: [facility];  Service: Orthopedics;  Laterality: Right;  with local anesthesia   [place]   COLONOSCOPY  2018   polyp removal fu 5 years   ESOPHAGOGASTRODUODENOSCOPY  10/2020   INNER EAR SURGERY Left 1983   left salpingo-oophorectomy     yrs ago after hysterectomy per pt on 09-28-2021   PLANTAR FASCIA SURGERY Bilateral    over 15 yrs ago per pt on 09-17-2021   ROTATOR CUFF REPAIR Bilateral    over 10 yrs ago   [place] N/A 02/15/2022   Procedure: SPINAL CORD STIMULATOR BATTERY EXCHANGE;  Surgeon: Melina Schools, MD;  Location: [facility];  Service: Orthopedics;  Laterality: N/A;   SPINAL CORD STIMULATOR INSERTION N/A 05/13/2016   Procedure: LUMBAR SPINAL CORD STIMULATOR INSERTION;  Surgeon: Melina Schools, MD;  Location: [facility];  Service: Orthopedics;  Laterality: N/A;   UPPER GASTROINTESTINAL ENDOSCOPY  2018    Current Medications: Current Meds  Medication Sig   Alirocumab 75 MG/ML SOAJ Inject 75 mg into the skin every 14 (fourteen) days.   amLODipine (NORVASC) 5 MG tablet Take 5 mg by mouth daily.   aspirin EC 81 MG tablet Take 1 tablet (81 mg total) by mouth daily. Swallow whole.   Coenzyme Q10 100 MG capsule Take 100 mg by mouth daily.   Continuous Blood Gluc Sensor (FREESTYLE LIBRE 2 SENSOR) MISC by Does not apply route.   dapagliflozin propanediol (FARXIGA) 5 MG TABS tablet Take 5 mg by mouth daily.   escitalopram (LEXAPRO) 5 MG tablet Take 5 mg by mouth daily.   ezetimibe (ZETIA) 10 MG tablet Take 1 tablet (10 mg total) by mouth daily.    gabapentin (NEURONTIN) 300 MG capsule Take 300 mg by mouth at bedtime.   metFORMIN (GLUCOPHAGE) 500 MG tablet Take 1,000 mg by mouth 2 (two) times daily.   methylphenidate (METADATE CD) 40 MG CR capsule Take 40 mg by mouth every morning.   metoprolol succinate (TOPROL-XL) 50 MG 24 hr tablet Take 50 mg by mouth at bedtime.   nitroGLYCERIN (NITROSTAT) 0.4 MG SL tablet Place 1 tablet (0.4 mg total) under the tongue every 5 (five) minutes as needed for chest pain.   ondansetron (ZOFRAN) 4 MG tablet Take 1 tablet (4 mg total) by mouth every 8 (eight) hours as needed for nausea or vomiting.   progesterone ([place])  100 MG capsule Take 100 mg by mouth at bedtime.   RABEprazole (ACIPHEX) 20 MG tablet Take 20 mg by mouth daily.   telmisartan-hydrochlorothiazide (MICARDIS HCT) 80-12.5 MG tablet Take 1 tablet by mouth daily.   tirzepatide (MOUNJARO) 10 MG/0.5ML Pen Inject 10 mg into the skin once a week. (Patient taking differently: Inject 10 mg into the skin every Tuesday.)   tirzepatide (MOUNJARO) 7.5 MG/0.5ML Pen Inject 7.5 mg into the skin once a week.     Allergies:   Actos [pioglitazone], Crestor [rosuvastatin], Pravachol [pravastatin], Quinapril hcl, Repatha [evolocumab], and Trulicity [dulaglutide]   Social History   Socioeconomic History   Marital status: Married    Spouse name: Not on file   Number of children: 1   Years of education: Not on file   Highest education level: Not on file  Occupational History   Not on file  Tobacco Use   Smoking status: Never   Smokeless tobacco: Never  Vaping Use   Vaping Use: Never used  Substance and Sexual Activity   Alcohol use: No   Drug use: No   Sexual activity: Not on file  Other Topics Concern   Not on file  Social History Narrative   Not on file   Social Determinants of Health   Financial Resource Strain: Not on file  Food Insecurity: Not on file  Transportation Needs: Not on file  Physical Activity: Not on file  Stress: Not on  file  Social Connections: Not on file     Family History: The patient's family history includes Diabetes in her father and mother; Heart disease in her mother. There is no history of Colon cancer, Colon polyps, Esophageal cancer, Rectal cancer, or Stomach cancer.  ROS:   Review of Systems  Constitutional:  Positive for malaise/fatigue and weight loss. Negative for chills, diaphoresis and fever.       Intentional weight loss of over 50 pounds since last December. See HPI.   HENT: Negative.    Eyes: Negative.   Respiratory: Negative.    Cardiovascular:  Positive for chest pain. Negative for palpitations, orthopnea, claudication, leg swelling and PND.  Gastrointestinal: Negative.   Genitourinary: Negative.   Musculoskeletal:  Positive for myalgias. Negative for back pain, falls, joint pain and neck pain.       See HPI.  Skin: Negative.   Neurological:  Positive for tingling. Negative for dizziness, tremors, sensory change, speech change, focal weakness, seizures, loss of consciousness, weakness and headaches.  Endo/Heme/Allergies: Negative.   Psychiatric/Behavioral:  Negative for depression, hallucinations, memory loss, substance abuse and suicidal ideas. The patient is nervous/anxious and has insomnia.    Please see the history of present illness.    All other systems reviewed and are negative.  EKGs/Labs/Other Studies Reviewed:    The following studies were reviewed today:   EKG:  EKG is not obtained today. RRR, S1/S2 noted, in NSR with HR of 77.  2D echocardiogram on Jan 13, 2022: LVEF is 60 to 65%.  Mild asymmetric left ventricular hypertrophy is a septal segment.  Grade 1 diastolic dysfunction noted.  Average left ventricular global longitudinal strain is 22.8%.  Global longitudinal strain is normal. Left atrial size is mildly dilated.  Trivial mitral valve regurgitation no evidence of mitral stenosis. Aortic valve is normal in structure.  Aortic valve regurgitation is mild, no  aortic stenosis is present. Borderline dilatation of the aortic root, measuring 36 mm. All other findings normal.  Coronary CTA with FFR on Jan 08, 2022:  Coronary calcium score 196.  96 percentile for her demographic. Normal coronary origin right dominance.  Proximal to mid RCA stenosis (50 to 75% (with abnormal FFR 0.97-0.80 suggestive of flow limitation.  Otherwise mild calcified plaque in LAD and circumflex.  CAD-RADS 4. Recommend cardiac catheterization.  No significant noncardiac abnormality identified. Abnormal FFR RCA.  0.80 post proximal to mid lesion.  Recommend cardiac catheterization.  Nuclear medicine stress test on July 02, 2011: No evidence for pharmacologically induced ischemia.  EF is 57% with normal wall motion.  Recent Labs: 02/15/2022: BUN 28; Creatinine, Ser 1.32; Hemoglobin 11.2; Platelets 253; Potassium 3.9; Sodium 138  Recent Lipid Panel    Component Value Date/Time   CHOL 221 (H) 07/02/2011 0135   TRIG 320 (H) 07/02/2011 0135   HDL 38 (L) 07/02/2011 0135   CHOLHDL 5.8 07/02/2011 0135   VLDL 64 (H) 07/02/2011 0135   LDLCALC 119 (H) 07/02/2011 0135     Risk Assessment/Calculations:    The 10-year ASCVD risk score (Arnett DK, et al., 2019) is: 7.7%   Values used to calculate the score:     Age: 30 years     Sex: Female     Is Non-Hispanic African American: No     Diabetic: Yes     Tobacco smoker: No     Systolic Blood Pressure: 295 mmHg     Is BP treated: Yes     HDL Cholesterol: 35 mg/dL     Total Cholesterol: 176 mg/dL       Physical Exam:    VS:  BP 120/86 (BP Location: Left Arm, Patient Position: Sitting, Cuff Size: Large)   Pulse 77   Ht '5\' 8"'$  (1.727 m)   Wt 175 lb (79.4 kg)   SpO2 100%   BMI 26.61 kg/m     Wt Readings from Last 3 Encounters:  04/14/22 175 lb (79.4 kg)  02/15/22 174 lb (78.9 kg)  01/12/22 172 lb (78 kg)     GEN: Well nourished, well developed in no acute distress HEENT: Normal NECK: No JVD; No carotid  bruits CARDIAC: RRR, no murmurs, rubs, gallops; 2+ peripheral pulses throughout, strong and equal bilaterally RESPIRATORY:  Clear to auscultation without rales, wheezing or rhonchi  ABDOMEN: Soft, non-tender, non-distended, bowel sounds X 4 MUSCULOSKELETAL:  No edema; No deformity  SKIN: Warm and dry NEUROLOGIC:  Alert and oriented x 3 PSYCHIATRIC:  Normal affect   ASSESSMENT:    1. Chest pain of uncertain etiology   2. Medication management   3. Pre-procedure lab exam   4. Mixed hyperlipidemia   5. Abnormal computed tomography angiography (CTA)    PLAN:    In order of problems listed above:  Chest pain of uncertain etiology, hx of abnormal coronary CTA, fatigue  - acute, not progressing This is a third time this patient has had chest pain since December 2022. No exertional CP. At her last visit with Dr. Gwenlyn Found in May 2023, he decided to medically manage her however given her recurrent chest pain, although it sounds very atypical for acute coronary syndrome, this patient is a likely candidate for left heart cath.  Coronary CTA in May 2023 suggested heart catheterization, revealed significant stenosis in the mid RCA. I discussed this case with DOD, Dr. Sallyanne Kuster, who agreed that this patient should get a left heart cath.  I went over risk and benefits of this procedure with the patient as outlined below.  She is agreeable to proceed with left heart cath with possible percutaneous  coronary intervention.  Dr. Gwenlyn Found will be performing this procedure on May 03, 2022.  In the meantime we will medically manage her.  Told her to seek ED care for any signs and symptoms of acute coronary syndrome, and she verbalizes understanding.  Will obtain preprocedure labs including CBC with diff and BMET.  Will also obtain a TSH, T3, T4 for history of daytime fatigue which is why she is taking Ritalin.  Told her to discuss different options besides Ritalin for daytime fatigue with her PCP.  Explained to her to  hold metformin, diabetes medications, and telmisartan-hydrochlorothiazide medication before her procedure.  Shared Decision Making/Informed Consent  The risks [stroke (1 in 1000), death (1 in 1000), kidney failure [usually temporary] (1 in 500), bleeding (1 in 200), allergic reaction [possibly serious] (1 in 200)], benefits (diagnostic support and management of coronary artery disease) and alternatives of a cardiac catheterization were discussed in detail with Ms. Meath and she is willing to proceed.    2. Coronary artery disease, HLD with LDL goal < 70, hx of statin intolerance - chronic, not progressing; medication management She is agreeable to proceed with left heart cath as outlined above.  Continue amlodipine 5 mg daily, metoprolol succinate 50 mg daily, telmisartan-hydrochlorothiazide 80-12.5 mg tablet daily.  Will initiate aspirin 81 mg daily and nitroglycerin 0.4 mg sublingual tablet as needed for chest pain according to protocol.  Last LDL was not at goal at 160 on October 26, 2021.  Since then she was started on Repatha; however, she is not able to tolerate this and she has a history of statin intolerance.  Will DC Repatha, add this to her allergy list, and initiate Zetia 10 mg daily.  Will also refer her to lipid clinic.  Will order fasting lipid panel and liver enzymes to be obtained with CBC with diff, BMET, and TSH.  3.  HTN - chronic, stable Initial blood pressure on arrival was 150/90, attributes this to recent stressors and travel with getting to the clinic appointment today.  Manual recheck by me was 120/86.  Denies any elevated blood pressure readings at home.  Continue current medication regimen.  Will obtain BMET as mentioned above.  Heart healthy, low-salt diet recommended.   4.  Disposition: Follow-up in 1 to 2 weeks post cath with APP and in 2 months with Dr. Gwenlyn Found    Medication Adjustments/Labs and Tests Ordered: Current medicines are reviewed at length with the patient  today.  Concerns regarding medicines are outlined above.  Orders Placed This Encounter  Procedures   Lipid panel   Hepatic function panel   [id number]   Basic Metabolic Panel (BMET)   CBC with Differential/Platelet   AMB Referral to [facility]   Meds ordered this encounter  Medications   aspirin EC 81 MG tablet    Sig: Take 1 tablet (81 mg total) by mouth daily. Swallow whole.    Dispense:  90 tablet    Refill:  3   ezetimibe (ZETIA) 10 MG tablet    Sig: Take 1 tablet (10 mg total) by mouth daily.    Dispense:  90 tablet    Refill:  3   nitroGLYCERIN (NITROSTAT) 0.4 MG SL tablet    Sig: Place 1 tablet (0.4 mg total) under the tongue every 5 (five) minutes as needed for chest pain.    Dispense:  90 tablet    Refill:  3    Patient Instructions  Medication Instructions:  Your physician has recommended  you make the following change in your medication:  START: Aspirin 81 mg once daily START: Zetia 10 mg once daily START: Nitroglycerin 0.4 mg place 1 tablet under your tongue every 5 minutes (up to three times) as needed for chest pain.  *If you need a refill on your cardiac medications before your next appointment, please call your pharmacy*   Lab Work: When fasting: Lipids, LFT, BMET, CBC, TSH  If you have labs (blood work) drawn today and your tests are completely normal, you will receive your results only by: [place] (if you have MyChart) OR A paper copy in the mail If you have any lab test that is abnormal or we need to change your treatment, we will call you to review the results.   Testing/Procedures:  [place] [address] Dept: [phone] Loc: [phone]  Alexis Black  04/14/2022  You are scheduled for a Cardiac Catheterization on Monday, August 28 with Dr. Quay Burow.  1. Please arrive at the Main Entrance A  at [facility]: [place], [place] [address] at 7:00 AM (This time is two hours before your procedure to ensure your preparation). Free valet parking service is available.   Special note: Every effort is made to have your procedure done on time. Please understand that emergencies sometimes delay scheduled procedures.  2. Diet: Do not eat solid foods after midnight.  You may have clear liquids until 5 AM upon the day of the procedure.  3. Labs: You will need to have blood drawn on week before cath.   4. Medication instructions in preparation for your procedure:   Contrast Allergy: No  HOLD taking, Telmisartan-Hydrochlorothiazide (Micardis) Monday, August 28.  Do not take Diabetes Med Glucophage (Metformin) on the day of the procedure and HOLD 48 HOURS AFTER THE PROCEDURE.  On the morning of your procedure, take Aspirin and any morning medicines NOT listed above.  You may use sips of water.  5. Plan to go home the same day, you will only stay overnight if medically necessary. 6. You MUST have a responsible adult to drive you home. 7. An adult MUST be with you the first 24 hours after you arrive home. 8. Bring a current list of your medications, and the last time and date medication taken. 9. Bring ID and current insurance cards. 10.Please wear clothes that are easy to get on and off and wear slip-on shoes.  Thank you for allowing [place] to care for you!   -- [facility]    Follow-Up: At [facility], you and your health needs are our priority.  As part of our continuing mission to provide you with exceptional heart care, we have created designated Provider Care Teams.  These Care Teams include your primary Cardiologist (physician) and Advanced Practice Providers (APPs -  Physician Assistants and Nurse Practitioners) who all work together to provide you with the care you need, when you need it.  We recommend signing up for the patient  portal called "MyChart".  Sign up information is provided on this After Visit Summary.  MyChart is used to connect with patients for Virtual Visits (Telemedicine).  Patients are able to view lab/test results, encounter notes, upcoming appointments, etc.  Non-urgent messages can be sent to your provider as well.   To learn more about what you can do with MyChart, go to [email or web address].  The format for your next appointment:   In Person  Provider:   Sande Rives, PA-C in 2 weeks then, Quay Burow, MD will plan to see you again in October   Other Instructions   Important Information About [place]         Signed, Finis Bud, NP  04/14/2022 4:06 PM    [place]

## 2022-04-14 ENCOUNTER — Ambulatory Visit (INDEPENDENT_AMBULATORY_CARE_PROVIDER_SITE_OTHER): Payer: 59 | Admitting: Nurse Practitioner

## 2022-04-14 ENCOUNTER — Encounter: Payer: Self-pay | Admitting: Student

## 2022-04-14 VITALS — BP 120/86 | HR 77 | Ht 68.0 in | Wt 175.0 lb

## 2022-04-14 DIAGNOSIS — R079 Chest pain, unspecified: Secondary | ICD-10-CM

## 2022-04-14 DIAGNOSIS — R5383 Other fatigue: Secondary | ICD-10-CM | POA: Diagnosis not present

## 2022-04-14 DIAGNOSIS — I2510 Atherosclerotic heart disease of native coronary artery without angina pectoris: Secondary | ICD-10-CM

## 2022-04-14 DIAGNOSIS — Z79899 Other long term (current) drug therapy: Secondary | ICD-10-CM

## 2022-04-14 DIAGNOSIS — Z01812 Encounter for preprocedural laboratory examination: Secondary | ICD-10-CM

## 2022-04-14 DIAGNOSIS — R9389 Abnormal findings on diagnostic imaging of other specified body structures: Secondary | ICD-10-CM | POA: Diagnosis not present

## 2022-04-14 DIAGNOSIS — Z789 Other specified health status: Secondary | ICD-10-CM

## 2022-04-14 DIAGNOSIS — I10 Essential (primary) hypertension: Secondary | ICD-10-CM

## 2022-04-14 DIAGNOSIS — E782 Mixed hyperlipidemia: Secondary | ICD-10-CM

## 2022-04-14 MED ORDER — NITROGLYCERIN 0.4 MG SL SUBL: 0.4000 mg | SUBLINGUAL_TABLET | SUBLINGUAL | 3 refills | Status: DC | PRN

## 2022-04-14 MED ORDER — EZETIMIBE 10 MG PO TABS: 10.0000 mg | ORAL_TABLET | Freq: Every day | ORAL | 3 refills | Status: DC

## 2022-04-14 MED ORDER — ASPIRIN 81 MG PO TBEC: 81.0000 mg | DELAYED_RELEASE_TABLET | Freq: Every day | ORAL | 3 refills | Status: DC

## 2022-04-14 NOTE — Patient Instructions (Addendum)
Medication Instructions:  Your physician has recommended you make the following change in your medication:  START: Aspirin 81 mg once daily START: Zetia 10 mg once daily START: Nitroglycerin 0.4 mg place 1 tablet under your tongue every 5 minutes (up to three times) as needed for chest pain. STOP: Praluent  *If you need a refill on your cardiac medications before your next appointment, please call your pharmacy*   Lab Work: When fasting: Lipids, LFT, BMET, CBC, TSH  If you have labs (blood work) drawn today and your tests are completely normal, you will receive your results only by: [place] (if you have MyChart) OR A paper copy in the mail If you have any lab test that is abnormal or we need to change your treatment, we will call you to review the results.   Testing/Procedures:  [place] [address] Dept: [phone] Loc: [phone]  LATRISHA COIRO  04/14/2022  You are scheduled for a Cardiac Catheterization on Monday, August 28 with Dr. Quay Burow.  1. Please arrive at the Main Entrance A at [facility]: [place], [place] [address] at 7:00 AM (This time is two hours before your procedure to ensure your preparation). Free valet parking service is available.   Special note: Every effort is made to have your procedure done on time. Please understand that emergencies sometimes delay scheduled procedures.  2. Diet: Do not eat solid foods after midnight.  You may have clear liquids until 5 AM upon the day of the procedure.  3. Labs: You will need to have blood drawn on week before cath.   4. Medication instructions in preparation for your procedure:   Contrast Allergy: No  HOLD taking, Telmisartan-Hydrochlorothiazide (Micardis) Monday, August 28.  Do not take Diabetes Med Glucophage (Metformin) on the day of the procedure and HOLD 48  HOURS AFTER THE PROCEDURE.  On the morning of your procedure, take Aspirin and any morning medicines NOT listed above.  You may use sips of water.  5. Plan to go home the same day, you will only stay overnight if medically necessary. 6. You MUST have a responsible adult to drive you home. 7. An adult MUST be with you the first 24 hours after you arrive home. 8. Bring a current list of your medications, and the last time and date medication taken. 9. Bring ID and current insurance cards. 10.Please wear clothes that are easy to get on and off and wear slip-on shoes.  Thank you for allowing [place] to care for you!   -- [place] [facility]    Follow-Up: At [facility], you and your health needs are our priority.  As part of our continuing mission to provide you with exceptional heart care, we have created designated Provider Care Teams.  These Care Teams include your primary Cardiologist (physician) and Advanced Practice Providers (APPs -  Physician Assistants and Nurse Practitioners) who all work together to provide you with the care you need, when you need it.  We recommend signing up for the patient portal called "MyChart".  Sign up information is provided on this After Visit Summary.  MyChart is used to connect with patients for Virtual Visits (Telemedicine).  Patients are able to view lab/test results, encounter notes, upcoming appointments, etc.  Non-urgent messages can be sent to your provider as well.   To learn more about what you can do with  MyChart, go to [email or web address].     The format for your next appointment:   In Person  Provider:   Sande Rives, PA-C in 2 weeks then, Quay Burow, MD will plan to see you again in October   Other Instructions   Important Information About [place]

## 2022-04-20 ENCOUNTER — Telehealth: Payer: Self-pay | Admitting: Cardiovascular Disease

## 2022-04-20 NOTE — Telephone Encounter (Signed)
Patient called stating that she wants to cancel the left heart cath procedure.  Patient stated she would like to discuss this with Dr. Gwenlyn Found and would like to schedule a televisit or sooner appointment.

## 2022-04-20 NOTE — Telephone Encounter (Signed)
Called patient back- she recently saw Finis Bud, NP- they set her up for a cath with Dr.Berry on 08/28- however, patient feels that this is not needed. She states her last visit with Dr.Berry he recommended to wait on the cath unless she was having increased chest pains, which she is not having- they did adjust some medications and this helps. Patient states NP also started her on Zetia, she does not want to take this, she is on Praluent (not on med list) and she states she feels she needs updated blood work before starting something new.   I did not cancel upcoming cath as of yet, as patient would like Dr.Berry to review and give opinion. She requested an appointment but I had no openings. She states she does not want talk to an APP, only Dr.Berry because she feels she was pushed to do this option.   Patient aware I will send a message over to MD/RN to review.  Thanks!

## 2022-04-20 NOTE — Telephone Encounter (Signed)
Called patient, advised of message from MD.  Patient would like to see MD first- I scheduled next opening 08/29 at 4:30 PM per patient request.   Called and canceled cath for 08/28.  Patient aware.  Thanks!

## 2022-04-30 ENCOUNTER — Other Ambulatory Visit: Payer: Self-pay | Admitting: Cardiovascular Disease

## 2022-04-30 ENCOUNTER — Other Ambulatory Visit (HOSPITAL_COMMUNITY): Payer: Self-pay

## 2022-04-30 ENCOUNTER — Telehealth: Payer: Self-pay | Admitting: Cardiovascular Disease

## 2022-04-30 DIAGNOSIS — I2510 Atherosclerotic heart disease of native coronary artery without angina pectoris: Secondary | ICD-10-CM

## 2022-04-30 DIAGNOSIS — E782 Mixed hyperlipidemia: Secondary | ICD-10-CM

## 2022-04-30 MED ORDER — ALIROCUMAB 75 MG/ML ~~LOC~~ SOAJ
1.0000 mL | SUBCUTANEOUS | 3 refills | Status: DC
  Filled 2022-04-30: qty 2, 28d supply, fill #0
  Filled 2022-05-26: qty 2, 28d supply, fill #1
  Filled 2022-07-06: qty 2, 28d supply, fill #2
  Filled 2022-08-01: qty 2, 30d supply, fill #3
  Filled 2022-08-16: qty 2, 28d supply, fill #3

## 2022-04-30 NOTE — Telephone Encounter (Signed)
Praluent Rx refilled

## 2022-04-30 NOTE — Telephone Encounter (Signed)
Spoke with pt, she states that she needs a Praulent refill. Spoke with PHARMD, he will call pt/refill medication

## 2022-04-30 NOTE — Telephone Encounter (Signed)
*  STAT* If patient is at the pharmacy, call can be transferred to refill team.   1. Which medications need to be refilled? (please list name of each medication and dose if known)  Praluent   2. Which pharmacy/location (including street and city if local pharmacy) is medication to be sent to? [facility]  3. Do they need a 30 day or 90 day supply? 69   Pt called stating she called her pharmacy to refill praluent but she said they told it was discontinued by [place]. She states at her last visit, she never specified that she didn't want to take it anymore. Please advise.

## 2022-05-03 ENCOUNTER — Encounter (HOSPITAL_COMMUNITY): Admission: RE | Payer: Self-pay | Source: Ambulatory Visit

## 2022-05-03 ENCOUNTER — Ambulatory Visit (HOSPITAL_COMMUNITY): Admission: RE | Admit: 2022-05-03 | Payer: 59 | Source: Ambulatory Visit | Admitting: Cardiovascular Disease

## 2022-05-03 DIAGNOSIS — E1151 Type 2 diabetes mellitus with diabetic peripheral angiopathy without gangrene: Secondary | ICD-10-CM | POA: Diagnosis not present

## 2022-05-03 DIAGNOSIS — I739 Peripheral vascular disease, unspecified: Secondary | ICD-10-CM | POA: Diagnosis not present

## 2022-05-03 DIAGNOSIS — G4733 Obstructive sleep apnea (adult) (pediatric): Secondary | ICD-10-CM | POA: Diagnosis not present

## 2022-05-03 DIAGNOSIS — I10 Essential (primary) hypertension: Secondary | ICD-10-CM | POA: Diagnosis not present

## 2022-05-03 DIAGNOSIS — E785 Hyperlipidemia, unspecified: Secondary | ICD-10-CM | POA: Diagnosis not present

## 2022-05-03 SURGERY — LEFT HEART CATH AND CORONARY ANGIOGRAPHY
Anesthesia: LOCAL

## 2022-05-04 ENCOUNTER — Encounter: Payer: Self-pay | Admitting: Cardiovascular Disease

## 2022-05-04 ENCOUNTER — Ambulatory Visit: Payer: 59 | Attending: Cardiovascular Disease | Admitting: Cardiovascular Disease

## 2022-05-04 DIAGNOSIS — R079 Chest pain, unspecified: Secondary | ICD-10-CM | POA: Diagnosis not present

## 2022-05-04 DIAGNOSIS — G4733 Obstructive sleep apnea (adult) (pediatric): Secondary | ICD-10-CM | POA: Diagnosis not present

## 2022-05-04 NOTE — Assessment & Plan Note (Signed)
Alexis Black returns today for follow-up.  She has had several episodes of chest pain since I saw her 3 months ago.  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  Based on this, we decided to proceed with outpatient radial diagnostic cath.  I have reviewed the risks, indications, and alternatives to cardiac catheterization, possible angioplasty, and stenting with the patient. Risks include but are not limited to bleeding, infection, vascular injury, stroke, myocardial infection, arrhythmia, kidney injury, radiation-related injury in the case of prolonged fluoroscopy use, emergency cardiac surgery, and death. The patient understands the risks of serious complication is 1-2 in 7543 with diagnostic cardiac cath and 1-2% or less with angioplasty/stenting.

## 2022-05-04 NOTE — Progress Notes (Unsigned)
05/04/2022 Alexis Black   06-06-1961  [id number]  Primary Physician Donnajean Lopes, MD Primary Cardiologist: Lorretta Harp MD [name], [place]  HPI:  Alexis Black is a 61 y.o.   mildly overweight married Caucasian female mother of 46, grandmother 1 grandchild who currently does not work.  She was referred back to Dr. Sharlett Iles, her PCP, for evaluation of chest pain.  Her mother, Alexis Black, was also a patient of mine who passed away in Dec 16, 2013.  I last saw her in the office 01/12/2022 her risk factors include treated hypertension and type 2 diabetes, untreated hyperlipidemia because of statin intolerance and family history with a father who had stents.  She is never had a heart attack or stroke.  She has lost 55 pounds over the last several months after stopping starting Ozempic.  She walks on occasion but is not particularly active.  She had an episode of chest pain back in December as well as in February.  She was admitted for 1 day in 2010-12-17 for chest pain and had a negative Myoview stress test at that time.    Since I saw her 3 months ago she has seen Finis Bud nurse practitioner who she described several episodes of chest pain to.  Based on this a heart cath was recommended however the patient never pursued this.  She is back here in follow-up.  Based on her symptoms, risk factors and CT results I recommended that she proceed with outpatient radial diagnostic coronary catheterization.   Current Meds  Medication Sig   Alirocumab 75 MG/ML SOAJ Inject 1 mL into the skin every 14 (fourteen) days.   amLODipine (NORVASC) 5 MG tablet Take 5 mg by mouth daily.   aspirin EC 81 MG tablet Take 1 tablet (81 mg total) by mouth daily. Swallow whole.   azithromycin (ZITHROMAX) 250 MG tablet Take 250 mg by mouth as directed.   Coenzyme Q10 100 MG capsule Take 100 mg by mouth daily.   Continuous Blood Gluc Sensor (FREESTYLE LIBRE 2 SENSOR) MISC by Does not apply route.    dapagliflozin propanediol (FARXIGA) 5 MG TABS tablet Take 5 mg by mouth daily.   escitalopram (LEXAPRO) 5 MG tablet Take 5 mg by mouth daily.   gabapentin (NEURONTIN) 300 MG capsule Take 300 mg by mouth at bedtime.   metFORMIN (GLUCOPHAGE) 500 MG tablet Take 1,000 mg by mouth 2 (two) times daily.   methylphenidate (METADATE CD) 40 MG CR capsule Take 40 mg by mouth every morning.   metoprolol succinate (TOPROL-XL) 50 MG 24 hr tablet Take 50 mg by mouth at bedtime.   nitroGLYCERIN (NITROSTAT) 0.4 MG SL tablet Place 1 tablet (0.4 mg total) under the tongue every 5 (five) minutes as needed for chest pain.   progesterone (PROMETRIUM) 100 MG capsule Take 100 mg by mouth at bedtime.   RABEprazole (ACIPHEX) 20 MG tablet Take 20 mg by mouth daily.   telmisartan-hydrochlorothiazide (MICARDIS HCT) 80-12.5 MG tablet Take 1 tablet by mouth daily.   tirzepatide (MOUNJARO) 10 MG/0.5ML Pen Inject 10 mg into the skin once a week. (Patient taking differently: Inject 10 mg into the skin every Tuesday.)     Allergies  Allergen Reactions   Actos [Pioglitazone] Other (See Comments)    bloated   Crestor [Rosuvastatin]     myalgias   Pravachol [Pravastatin]     Other reaction(s): had muscle cramps   Quinapril Hcl     Other reaction(s): Unknown  Repatha [Evolocumab] Other (See Comments)    Myalgias   Trulicity [Dulaglutide]     Other reaction(s): nausea with trulicity    Social History   Socioeconomic History   Marital status: Married    Spouse name: Not on file   Number of children: 1   Years of education: Not on file   Highest education level: Not on file  Occupational History   Not on file  Tobacco Use   Smoking status: Never   Smokeless tobacco: Never  Vaping Use   Vaping Use: Never used  Substance and Sexual Activity   Alcohol use: No   Drug use: No   Sexual activity: Not on file  Other Topics Concern   Not on file  Social History Narrative   Not on file   Social Determinants of  Health   Financial Resource Strain: Not on file  Food Insecurity: Not on file  Transportation Needs: Not on file  Physical Activity: Not on file  Stress: Not on file  Social Connections: Not on file  Intimate Partner Violence: Not on file     Review of Systems: General: negative for chills, fever, night sweats or weight changes.  Cardiovascular: negative for chest pain, dyspnea on exertion, edema, orthopnea, palpitations, paroxysmal nocturnal dyspnea or shortness of breath Dermatological: negative for rash Respiratory: negative for cough or wheezing Urologic: negative for hematuria Abdominal: negative for nausea, vomiting, diarrhea, bright red blood per rectum, melena, or hematemesis Neurologic: negative for visual changes, syncope, or dizziness All other systems reviewed and are otherwise negative except as noted above.    Blood pressure 120/70, pulse 76, height '5\' 8"'$  (1.727 m), weight 176 lb 12.8 oz (80.2 kg), SpO2 92 %.  General appearance: alert and no distress Neck: no adenopathy, no carotid bruit, no JVD, supple, symmetrical, trachea midline, and thyroid not enlarged, symmetric, no tenderness/mass/nodules Lungs: clear to auscultation bilaterally Heart: regular rate and rhythm, S1, S2 normal, no murmur, click, rub or gallop Extremities: extremities normal, atraumatic, no cyanosis or edema Pulses: 2+ and symmetric Skin: Skin color, texture, turgor normal. No rashes or lesions Neurologic: Grossly normal  EKG normal sinus rhythm at 76 without ST or T wave changes.  Personally reviewed this EKG.  ASSESSMENT AND PLAN:   Chest pain of uncertain etiology [name] returns today for follow-up.  She has had several episodes of chest pain since I saw her 3 months ago.  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  Based on this, we decided to proceed with outpatient radial diagnostic cath.   I have reviewed the risks, indications, and  alternatives to cardiac catheterization, possible angioplasty, and stenting with the patient. Risks include but are not limited to bleeding, infection, vascular injury, stroke, myocardial infection, arrhythmia, kidney injury, radiation-related injury in the case of prolonged fluoroscopy use, emergency cardiac surgery, and death. The patient understands the risks of serious complication is 1-2 in 2458 with diagnostic cardiac cath and 1-2% or less with angioplasty/stenting.      Lorretta Harp MD FACP,FACC,FAHA, [facility] 05/04/2022 4:48 PM

## 2022-05-04 NOTE — H&P (View-Only) (Signed)
05/04/2022 Alexis Black   [birth year]  [id number]  Primary Physician Alexis Lopes, MD Primary Cardiologist: Alexis Harp MD Alexis Black, [place]  HPI:  Alexis Black is a 61 y.o.   mildly overweight married Caucasian female mother of 63, grandmother 1 grandchild who currently does not work.  She was referred back to Dr. Sharlett Black, her PCP, for evaluation of chest pain.  Her mother, Alexis Black, was also a patient of mine who passed away in December 15, 2013.  I last saw her in the office 01/12/2022 her risk factors include treated hypertension and type 2 diabetes, untreated hyperlipidemia because of statin intolerance and family history with a father who had stents.  She is never had a heart attack or stroke.  She has lost 55 pounds over the last several months after stopping starting Ozempic.  She walks on occasion but is not particularly active.  She had an episode of chest pain back in December as well as in February.  She was admitted for 1 day in 12/16/10 for chest pain and had a negative Myoview stress test at that time.    Since I saw her 3 months ago she has seen Alexis Black nurse practitioner who she described several episodes of chest pain to.  Based on this a heart cath was recommended however the patient never pursued this.  She is back here in follow-up.  Based on her symptoms, risk factors and CT results I recommended that she proceed with outpatient radial diagnostic coronary catheterization.   Current Meds  Medication Sig   Alirocumab 75 MG/ML SOAJ Inject 1 mL into the skin every 14 (fourteen) days.   amLODipine (NORVASC) 5 MG tablet Take 5 mg by mouth daily.   aspirin EC 81 MG tablet Take 1 tablet (81 mg total) by mouth daily. Swallow whole.   azithromycin (ZITHROMAX) 250 MG tablet Take 250 mg by mouth as directed.   Coenzyme Q10 100 MG capsule Take 100 mg by mouth daily.   Continuous Blood Gluc Sensor (FREESTYLE LIBRE 2 SENSOR) MISC by Does not apply route.    dapagliflozin propanediol (FARXIGA) 5 MG TABS tablet Take 5 mg by mouth daily.   escitalopram (LEXAPRO) 5 MG tablet Take 5 mg by mouth daily.   gabapentin (NEURONTIN) 300 MG capsule Take 300 mg by mouth at bedtime.   metFORMIN (GLUCOPHAGE) 500 MG tablet Take 1,000 mg by mouth 2 (two) times daily.   methylphenidate (METADATE CD) 40 MG CR capsule Take 40 mg by mouth every morning.   metoprolol succinate (TOPROL-XL) 50 MG 24 hr tablet Take 50 mg by mouth at bedtime.   nitroGLYCERIN (NITROSTAT) 0.4 MG SL tablet Place 1 tablet (0.4 mg total) under the tongue every 5 (five) minutes as needed for chest pain.   progesterone (PROMETRIUM) 100 MG capsule Take 100 mg by mouth at bedtime.   RABEprazole (ACIPHEX) 20 MG tablet Take 20 mg by mouth daily.   telmisartan-hydrochlorothiazide (MICARDIS HCT) 80-12.5 MG tablet Take 1 tablet by mouth daily.   tirzepatide (MOUNJARO) 10 MG/0.5ML Pen Inject 10 mg into the skin once a week. (Patient taking differently: Inject 10 mg into the skin every Tuesday.)     Allergies  Allergen Reactions   Actos [Pioglitazone] Other (See Comments)    bloated   Crestor [Rosuvastatin]     myalgias   Pravachol [Pravastatin]     Other reaction(s): had muscle cramps   Quinapril Hcl     Other reaction(s): Unknown  Repatha [Evolocumab] Other (See Comments)    Myalgias   Trulicity [Dulaglutide]     Other reaction(s): nausea with trulicity    Social History   Socioeconomic History   Marital status: Married    Spouse name: Not on file   Number of children: 1   Years of education: Not on file   Highest education level: Not on file  Occupational History   Not on file  Tobacco Use   Smoking status: Never   Smokeless tobacco: Never  Vaping Use   Vaping Use: Never used  Substance and Sexual Activity   Alcohol use: No   Drug use: No   Sexual activity: Not on file  Other Topics Concern   Not on file  Social History Narrative   Not on file   Social Determinants of  Health   Financial Resource Strain: Not on file  Food Insecurity: Not on file  Transportation Needs: Not on file  Physical Activity: Not on file  Stress: Not on file  Social Connections: Not on file  Intimate Partner Violence: Not on file     Review of Systems: General: negative for chills, fever, night sweats or weight changes.  Cardiovascular: negative for chest pain, dyspnea on exertion, edema, orthopnea, palpitations, paroxysmal nocturnal dyspnea or shortness of breath Dermatological: negative for rash Respiratory: negative for cough or wheezing Urologic: negative for hematuria Abdominal: negative for nausea, vomiting, diarrhea, bright red blood per rectum, melena, or hematemesis Neurologic: negative for visual changes, syncope, or dizziness All other systems reviewed and are otherwise negative except as noted above.    Blood pressure 120/70, pulse 76, height '5\' 8"'$  (1.727 m), weight 176 lb 12.8 oz (80.2 kg), SpO2 92 %.  General appearance: alert and no distress Neck: no adenopathy, no carotid bruit, no JVD, supple, symmetrical, trachea midline, and thyroid not enlarged, symmetric, no tenderness/mass/nodules Lungs: clear to auscultation bilaterally Heart: regular rate and rhythm, S1, S2 normal, no murmur, click, rub or gallop Extremities: extremities normal, atraumatic, no cyanosis or edema Pulses: 2+ and symmetric Skin: Skin color, texture, turgor normal. No rashes or lesions Neurologic: Grossly normal  EKG normal sinus rhythm at 76 without ST or T wave changes.  Personally reviewed this EKG.  ASSESSMENT AND PLAN:   Chest pain of uncertain etiology Alexis Black returns today for follow-up.  She has had several episodes of chest pain since I saw her 3 months ago.  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  Based on this, we decided to proceed with outpatient radial diagnostic cath.   I have reviewed the risks, indications, and  alternatives to cardiac catheterization, possible angioplasty, and stenting with the patient. Risks include but are not limited to bleeding, infection, vascular injury, stroke, myocardial infection, arrhythmia, kidney injury, radiation-related injury in the case of prolonged fluoroscopy use, emergency cardiac surgery, and death. The patient understands the risks of serious complication is 1-2 in 1916 with diagnostic cardiac cath and 1-2% or less with angioplasty/stenting.      Alexis Harp MD FACP,FACC,FAHA, [facility] 05/04/2022 4:48 PM

## 2022-05-04 NOTE — Patient Instructions (Signed)
Medication Instructions:  Your physician recommends that you continue on your current medications as directed. Please refer to the Current Medication list given to you today.  *If you need a refill on your cardiac medications before your next appointment, please call your pharmacy*   Lab Work: Your physician recommends that you return for lab work: CMET, CBC, Lipids  If you have labs (blood work) drawn today and your tests are completely normal, you will receive your results only by: MyChart Message (if you have MyChart) OR A paper copy in the mail If you have any lab test that is abnormal or we need to change your treatment, we will call you to review the results.   Testing/Procedures: See below   Follow-Up: At [facility], you and your health needs are our priority.  As part of our continuing mission to provide you with exceptional heart care, we have created designated Provider Care Teams.  These Care Teams include your primary Cardiologist (physician) and Advanced Practice Providers (APPs -  Physician Assistants and Nurse Practitioners) who all work together to provide you with the care you need, when you need it.  We recommend signing up for the patient portal called "MyChart".  Sign up information is provided on this After Visit Summary.  MyChart is used to connect with patients for Virtual Visits (Telemedicine).  Patients are able to view lab/test results, encounter notes, upcoming appointments, etc.  Non-urgent messages can be sent to your provider as well.   To learn more about what you can do with MyChart, go to [email or web address].    Your next appointment:   1-2 week(s) after your procedure (9/11)  The format for your next appointment:   In Person  Provider:   Quay Burow, MD    Other Instructions  [place] [facility] DE[facility] [place] [facility] DEPT OF [place]. [facility] [place] [id number] [place] [address] Dept: [phone] Loc: [phone]  MARTHE DANT  05/04/2022  You are scheduled for a Cardiac Catheterization on Monday, September 11 with Dr. Quay Burow.  1. Please arrive at the [facility] (Main Entrance A) at [facility]: [address] [place], [place] [address] at 5:30 AM (This time is two hours before your procedure to ensure your preparation). Free valet parking service is available.   Special note: Every effort is made to have your procedure done on time. Please understand that emergencies sometimes delay scheduled procedures.  2. Diet: Do not eat solid foods after midnight.  The patient may have clear liquids until 5am upon the day of the procedure.  3. Labs: You will need to have blood drawn on 05/14/22  4. Medication instructions in preparation for your procedure:   Stop taking, micardis Monday, September 18,  Do not take Diabetes Med Glucophage (Metformin) on the day of the procedure and HOLD 48 HOURS AFTER THE PROCEDURE.  Do not take Mounjaro on Tuesday September 5  On the morning of your procedure, take your Aspirin and any morning medicines NOT listed above.  You may use sips of water.  5. Plan for one night stay--bring personal belongings. 6. Bring a current list of your medications and current insurance cards. 7. You MUST have a responsible person to drive you home. 8. Someone MUST be with you the first 24 hours after you arrive home or your discharge will be delayed. 9. Please wear clothes that are  easy to get on and off and wear slip-on shoes.  Thank you for allowing [place] to care for you!   -- [place] [facility]

## 2022-05-05 ENCOUNTER — Other Ambulatory Visit: Payer: Self-pay

## 2022-05-05 NOTE — Addendum Note (Signed)
Addended by: Beatrix Fetters on: 05/05/2022 08:44 AM   Modules accepted: Orders

## 2022-05-07 ENCOUNTER — Other Ambulatory Visit: Payer: Self-pay

## 2022-05-07 DIAGNOSIS — R931 Abnormal findings on diagnostic imaging of heart and coronary circulation: Secondary | ICD-10-CM

## 2022-05-07 DIAGNOSIS — R9389 Abnormal findings on diagnostic imaging of other specified body structures: Secondary | ICD-10-CM

## 2022-05-07 MED ORDER — SODIUM CHLORIDE 0.9% FLUSH: 3.0000 mL | Freq: Two times a day (BID) | INTRAVENOUS | Status: AC

## 2022-05-12 ENCOUNTER — Telehealth: Payer: Self-pay | Admitting: Cardiovascular Disease

## 2022-05-12 NOTE — Telephone Encounter (Signed)
Called patient, advised that she received a form in the mail that her heart cath was denied, based that the testing that showed she had a blockage was not sufficient.  She stated they faxed over something yesterday, but did provide number below for MD to call:   [phone] option 1.   Advised with patient I would send to MD and RN to review.   Thanks!

## 2022-05-12 NOTE — Telephone Encounter (Signed)
Pt is calling stating that her insurance has denied procedure coming up and request call back to discuss what she needs to do or if there can be anything done to change this. Please advise.

## 2022-05-13 ENCOUNTER — Telehealth: Payer: Self-pay | Admitting: *Deleted

## 2022-05-13 NOTE — Telephone Encounter (Signed)
Alexis Harp, MD  Caprice Beaver, LPN 15 hours ago (4:70 PM)    I did a Peer to Peer. Authorization # for cath: [id number]   Alexis Black with pt to make her aware. She states that her insurance company also reached out to her yesterday to let her know that Dr. Gwenlyn Found had completed a peer to peer. Pt verbalizes understanding.

## 2022-05-13 NOTE — Telephone Encounter (Signed)
Cardiac Catheterization scheduled at [facility] for: Monday May 17, 2022 7:30 AM Arrival time and place: [place] Entrance A at: 5:30 AM  Nothing to eat after midnight prior to procedure, clear liquids until 5 AM day of procedure.  Medication instructions: -Hold:  Metformin-day of procedure and 48 hours post procedure  Farxiga-AM of procedure  Telmisartan/HCTZ-AM of procedure  Mounjaro-pt did not take Tuesday Sept 5-will hold until post procedure -Except hold medications usual morning medications can be taken with sips of water including aspirin 81 mg.  Confirmed patient has responsible adult to drive home post procedure and be with patient first 24 hours after arriving home.  Patient reports no new symptoms concerning for COVID-19 in the past 10 days.  Reviewed procedure instructions with patient.

## 2022-05-14 ENCOUNTER — Other Ambulatory Visit (HOSPITAL_COMMUNITY): Payer: Self-pay

## 2022-05-14 DIAGNOSIS — R079 Chest pain, unspecified: Secondary | ICD-10-CM | POA: Diagnosis not present

## 2022-05-14 DIAGNOSIS — E785 Hyperlipidemia, unspecified: Secondary | ICD-10-CM | POA: Diagnosis not present

## 2022-05-14 MED ORDER — MOUNJARO 12.5 MG/0.5ML ~~LOC~~ SOAJ
12.5000 mg | SUBCUTANEOUS | 3 refills | Status: AC
  Filled 2022-05-14: qty 2, 28d supply, fill #0
  Filled 2022-05-26 – 2022-06-15 (×3): qty 2, 28d supply, fill #1
  Filled 2022-07-06: qty 2, 28d supply, fill #2
  Filled 2022-08-01: qty 2, 28d supply, fill #3
  Filled 2022-08-31: qty 2, 28d supply, fill #4
  Filled 2022-09-26: qty 2, 28d supply, fill #5
  Filled 2023-02-01: qty 2, 28d supply, fill #6

## 2022-05-15 LAB — BASIC METABOLIC PANEL
BUN/Creatinine Ratio: 16 (ref 9–23)
BUN: 21 mg/dL (ref 6–24)
CO2: 23 mmol/L (ref 20–29)
Calcium: 8.8 mg/dL (ref 8.7–10.2)
Chloride: 99 mmol/L (ref 96–106)
Creatinine, Ser: 1.29 mg/dL — ABNORMAL HIGH (ref 0.57–1.00)
Glucose: 117 mg/dL — ABNORMAL HIGH (ref 70–99)
Potassium: 4.2 mmol/L (ref 3.5–5.2)
Sodium: 141 mmol/L (ref 134–144)
eGFR: 48 mL/min/{1.73_m2} — ABNORMAL LOW (ref >59)

## 2022-05-15 LAB — LIPID PANEL
Chol/HDL Ratio: 5.4 ratio — ABNORMAL HIGH (ref 0.0–4.4)
Cholesterol, Total: 188 mg/dL (ref 100–199)
HDL: 35 mg/dL — ABNORMAL LOW (ref >39)
LDL Chol Calc (NIH): 80 mg/dL (ref 0–99)
Triglycerides: 457 mg/dL — ABNORMAL HIGH (ref 0–149)
VLDL Cholesterol Cal: 73 mg/dL — ABNORMAL HIGH (ref 5–40)

## 2022-05-15 LAB — CBC
Hematocrit: 34.7 % (ref 34.0–46.6)
Hemoglobin: 11.5 g/dL (ref 11.1–15.9)
MCH: 28.1 pg (ref 26.6–33.0)
MCHC: 33.1 g/dL (ref 31.5–35.7)
MCV: 85 fL (ref 79–97)
Platelets: 292 10*3/uL (ref 150–450)
RBC: 4.09 x10E6/uL (ref 3.77–5.28)
RDW: 13.5 % (ref 11.7–15.4)
WBC: 6.2 10*3/uL (ref 3.4–10.8)

## 2022-05-17 ENCOUNTER — Encounter (HOSPITAL_COMMUNITY): Payer: Self-pay | Admitting: Cardiovascular Disease

## 2022-05-17 ENCOUNTER — Ambulatory Visit (HOSPITAL_COMMUNITY)
Admission: RE | Disposition: A | Discharge status: Home / Self Care | Payer: Self-pay | Source: Home / Self Care | Attending: Cardiovascular Disease

## 2022-05-17 ENCOUNTER — Other Ambulatory Visit: Payer: Self-pay

## 2022-05-17 ENCOUNTER — Ambulatory Visit (HOSPITAL_COMMUNITY)
Admission: RE | Admit: 2022-05-17 | Discharge: 2022-05-17 | Disposition: A | Discharge status: Home / Self Care | Payer: 59 | Attending: Cardiovascular Disease | Admitting: Cardiovascular Disease

## 2022-05-17 DIAGNOSIS — R079 Chest pain, unspecified: Secondary | ICD-10-CM | POA: Insufficient documentation

## 2022-05-17 DIAGNOSIS — I10 Essential (primary) hypertension: Secondary | ICD-10-CM | POA: Diagnosis not present

## 2022-05-17 DIAGNOSIS — R931 Abnormal findings on diagnostic imaging of heart and coronary circulation: Secondary | ICD-10-CM

## 2022-05-17 DIAGNOSIS — E119 Type 2 diabetes mellitus without complications: Secondary | ICD-10-CM | POA: Diagnosis not present

## 2022-05-17 DIAGNOSIS — E785 Hyperlipidemia, unspecified: Secondary | ICD-10-CM | POA: Diagnosis not present

## 2022-05-17 DIAGNOSIS — I2510 Atherosclerotic heart disease of native coronary artery without angina pectoris: Secondary | ICD-10-CM | POA: Diagnosis not present

## 2022-05-17 HISTORY — PX: LEFT HEART CATH AND CORONARY ANGIOGRAPHY: CATH118249

## 2022-05-17 LAB — GLUCOSE, CAPILLARY: Glucose-Capillary: 117 mg/dL — ABNORMAL HIGH (ref 70–99)

## 2022-05-17 SURGERY — LEFT HEART CATH AND CORONARY ANGIOGRAPHY
Anesthesia: LOCAL

## 2022-05-17 MED ORDER — LABETALOL HCL 5 MG/ML IV SOLN: 10.0000 mg | INTRAVENOUS | Status: DC | PRN

## 2022-05-17 MED ORDER — SODIUM CHLORIDE 0.9 % IV SOLN: 250.0000 mL | INTRAVENOUS | Status: DC | PRN

## 2022-05-17 MED ORDER — ASPIRIN 81 MG PO CHEW: 81.0000 mg | CHEWABLE_TABLET | ORAL | Status: DC

## 2022-05-17 MED ORDER — SODIUM CHLORIDE 0.9 % IV SOLN: INTRAVENOUS | Status: DC

## 2022-05-17 MED ORDER — ACETAMINOPHEN 325 MG PO TABS: 650.0000 mg | ORAL_TABLET | ORAL | Status: DC | PRN

## 2022-05-17 MED ORDER — FENTANYL CITRATE (PF) 100 MCG/2ML IJ SOLN
INTRAMUSCULAR | Status: AC
  Filled 2022-05-17: qty 2

## 2022-05-17 MED ORDER — HYDRALAZINE HCL 20 MG/ML IJ SOLN: 10.0000 mg | INTRAMUSCULAR | Status: DC | PRN

## 2022-05-17 MED ORDER — HEPARIN SODIUM (PORCINE) 1000 UNIT/ML IJ SOLN
INTRAMUSCULAR | Status: AC
  Filled 2022-05-17: qty 10

## 2022-05-17 MED ORDER — SODIUM CHLORIDE 0.9% FLUSH: 3.0000 mL | Freq: Two times a day (BID) | INTRAVENOUS | Status: DC

## 2022-05-17 MED ORDER — HEPARIN (PORCINE) IN NACL 1000-0.9 UT/500ML-% IV SOLN
INTRAVENOUS | Status: DC | PRN
  Administered 2022-05-17 (×2): 500 mL

## 2022-05-17 MED ORDER — MORPHINE SULFATE (PF) 2 MG/ML IV SOLN: 2.0000 mg | INTRAVENOUS | Status: DC | PRN

## 2022-05-17 MED ORDER — SODIUM CHLORIDE 0.9 % WEIGHT BASED INFUSION
3.0000 mL/kg/h | INTRAVENOUS | Status: AC
  Administered 2022-05-17: 3 mL/kg/h via INTRAVENOUS

## 2022-05-17 MED ORDER — SODIUM CHLORIDE 0.9% FLUSH: 3.0000 mL | INTRAVENOUS | Status: DC | PRN

## 2022-05-17 MED ORDER — IOHEXOL 350 MG/ML SOLN
INTRAVENOUS | Status: DC | PRN
  Administered 2022-05-17: 55 mL

## 2022-05-17 MED ORDER — MIDAZOLAM HCL 2 MG/2ML IJ SOLN
INTRAMUSCULAR | Status: AC
  Filled 2022-05-17: qty 2

## 2022-05-17 MED ORDER — MIDAZOLAM HCL 2 MG/2ML IJ SOLN
INTRAMUSCULAR | Status: DC | PRN
  Administered 2022-05-17 (×2): 1 mg via INTRAVENOUS

## 2022-05-17 MED ORDER — LIDOCAINE HCL (PF) 1 % IJ SOLN
INTRAMUSCULAR | Status: DC | PRN
  Administered 2022-05-17: 2 mL
  Administered 2022-05-17: 10 mL

## 2022-05-17 MED ORDER — SODIUM CHLORIDE 0.9 % WEIGHT BASED INFUSION: 1.0000 mL/kg/h | INTRAVENOUS | Status: DC

## 2022-05-17 MED ORDER — NITROGLYCERIN 1 MG/10 ML FOR IR/CATH LAB
INTRA_ARTERIAL | Status: AC
  Filled 2022-05-17: qty 10

## 2022-05-17 MED ORDER — ASPIRIN 81 MG PO CHEW
81.0000 mg | CHEWABLE_TABLET | ORAL | Status: AC
  Administered 2022-05-17: 81 mg via ORAL
  Filled 2022-05-17: qty 1

## 2022-05-17 MED ORDER — ASPIRIN 81 MG PO CHEW: 81.0000 mg | CHEWABLE_TABLET | Freq: Every day | ORAL | Status: DC

## 2022-05-17 MED ORDER — LIDOCAINE HCL (PF) 1 % IJ SOLN
INTRAMUSCULAR | Status: AC
  Filled 2022-05-17: qty 30

## 2022-05-17 MED ORDER — VERAPAMIL HCL 2.5 MG/ML IV SOLN
INTRAVENOUS | Status: AC
  Filled 2022-05-17: qty 2

## 2022-05-17 MED ORDER — FENTANYL CITRATE (PF) 100 MCG/2ML IJ SOLN
INTRAMUSCULAR | Status: DC | PRN
  Administered 2022-05-17 (×2): 25 ug via INTRAVENOUS

## 2022-05-17 MED ORDER — ONDANSETRON HCL 4 MG/2ML IJ SOLN: 4.0000 mg | Freq: Four times a day (QID) | INTRAMUSCULAR | Status: DC | PRN

## 2022-05-17 MED ORDER — HEPARIN (PORCINE) IN NACL 1000-0.9 UT/500ML-% IV SOLN
INTRAVENOUS | Status: AC
  Filled 2022-05-17: qty 1000

## 2022-05-17 SURGICAL SUPPLY — 11 items
CATH INFINITI 5FR MULTPACK ANG (CATHETERS) IMPLANT
CLOSURE MYNX CONTROL 5F (Vascular Products) IMPLANT
GLIDESHEATH SLEND A-KIT 6F 22G (SHEATH) IMPLANT
KIT HEART LEFT (KITS) ×1 IMPLANT
PACK CARDIAC CATHETERIZATION (CUSTOM PROCEDURE TRAY) ×1 IMPLANT
SHEATH PINNACLE 5F 10CM (SHEATH) IMPLANT
SHEATH PROBE COVER 6X72 (BAG) IMPLANT
TRANSDUCER W/STOPCOCK (MISCELLANEOUS) ×1 IMPLANT
TUBING CIL FLEX 10 FLL-RA (TUBING) ×1 IMPLANT
WIRE EMERALD 3MM-J .035X150CM (WIRE) IMPLANT
WIRE HI TORQ VERSACORE-J 145CM (WIRE) IMPLANT

## 2022-05-17 NOTE — Interval H&P Note (Signed)
Cath Lab Visit (complete for each Cath Lab visit)  Clinical Evaluation Leading to the Procedure:   ACS: No.  Non-ACS:    Anginal Classification: CCS II  Anti-ischemic medical therapy: Minimal Therapy (1 class of medications)  Non-Invasive Test Results: Low-risk stress test findings: cardiac mortality <1%/year  Prior CABG: No previous CABG      History and Physical Interval Note:  05/17/2022 7:38 AM  Alexis Black  has presented today for surgery, with the diagnosis of cad.  The various methods of treatment have been discussed with the patient and family. After consideration of risks, benefits and other options for treatment, the patient has consented to  Procedure(s): LEFT HEART CATH AND CORONARY ANGIOGRAPHY (N/A) as a surgical intervention.  The patient's history has been reviewed, patient examined, no change in status, stable for surgery.  I have reviewed the patient's chart and labs.  Questions were answered to the patient's satisfaction.     Quay Burow

## 2022-05-18 ENCOUNTER — Encounter (INDEPENDENT_AMBULATORY_CARE_PROVIDER_SITE_OTHER): Payer: Self-pay | Admitting: Ophthalmology

## 2022-05-18 MED FILL — Nitroglycerin IV Soln 100 MCG/ML in D5W: INTRA_ARTERIAL | Qty: 10 | Status: AC

## 2022-05-18 MED FILL — Verapamil HCl IV Soln 2.5 MG/ML: INTRAVENOUS | Qty: 2 | Status: AC

## 2022-05-24 ENCOUNTER — Encounter: Payer: Self-pay | Admitting: Internal Medicine

## 2022-05-26 ENCOUNTER — Encounter: Payer: Self-pay | Admitting: Cardiovascular Disease

## 2022-05-26 ENCOUNTER — Other Ambulatory Visit (HOSPITAL_COMMUNITY): Payer: Self-pay

## 2022-05-26 ENCOUNTER — Ambulatory Visit: Payer: 59 | Attending: Cardiovascular Disease | Admitting: Cardiovascular Disease

## 2022-05-26 DIAGNOSIS — E782 Mixed hyperlipidemia: Secondary | ICD-10-CM | POA: Diagnosis not present

## 2022-05-26 DIAGNOSIS — I10 Essential (primary) hypertension: Secondary | ICD-10-CM

## 2022-05-26 DIAGNOSIS — R931 Abnormal findings on diagnostic imaging of heart and coronary circulation: Secondary | ICD-10-CM | POA: Diagnosis not present

## 2022-05-26 NOTE — Assessment & Plan Note (Signed)
Patient had coronary CTA that suggested a RCA lesion 01/08/2022.  Outpatient cardiac catheterization performed by myself 05/17/2022 showed mild nonobstructive disease.  She no longer has chest pain.

## 2022-05-26 NOTE — Assessment & Plan Note (Signed)
History of essential hypertension a blood pressure measured today at 118/66.  She is on amlodipine, metoprolol, Micardis and hydrochlorothiazide.

## 2022-05-26 NOTE — Assessment & Plan Note (Signed)
History of hyperlipidemia on Praluent with lipid profile performed 05/14/2022 revealing total cholesterol 188, LDL of 80 and HDL of 35.  Her her triglyceride level was 457.  I am referring her to Dr. Debara Pickett in lipid clinic for further evaluation and treatment of hypertriglyceridemia.

## 2022-05-26 NOTE — Patient Instructions (Signed)
Medication Instructions:  Your physician recommends that you continue on your current medications as directed. Please refer to the Current Medication list given to you today.  *If you need a refill on your cardiac medications before your next appointment, please call your pharmacy*   Follow-Up: At [facility], you and your health needs are our priority.  As part of our continuing mission to provide you with exceptional heart care, we have created designated Provider Care Teams.  These Care Teams include your primary Cardiologist (physician) and Advanced Practice Providers (APPs -  Physician Assistants and Nurse Practitioners) who all work together to provide you with the care you need, when you need it.  We recommend signing up for the patient portal called "MyChart".  Sign up information is provided on this After Visit Summary.  MyChart is used to connect with patients for Virtual Visits (Telemedicine).  Patients are able to view lab/test results, encounter notes, upcoming appointments, etc.  Non-urgent messages can be sent to your provider as well.   To learn more about what you can do with MyChart, go to [email or web address].    Your next appointment:   We will see you on an as needed basis.  Provider:   Jonathan Berry, MD  

## 2022-05-26 NOTE — Progress Notes (Signed)
05/26/2022 Alexis Black   06-14-1961  [id number]  Primary Physician Donnajean Lopes, MD Primary Cardiologist: Lorretta Harp MD [name], [place]  HPI:  Alexis Black is a 61 y.o.  mildly overweight married Caucasian female mother of 55, grandmother 1 grandchild who currently does not work.  She was referred to me by Dr. Sharlett Iles, her PCP, for evaluation of chest pain.  Her mother, Alexis Black, was also a patient of mine who passed away in 11-26-13.  She is accompanied by her husband Alexis Black who is also a patient of mine.  I last saw her in the office 05/04/2022 her risk factors include treated hypertension and type 2 diabetes, untreated hyperlipidemia because of statin intolerance and family history with a father who had stents.  She is never had a heart attack or stroke.  She has lost 55 pounds over the last several months after stopping starting Ozempic.  She walks on occasion but is not particularly active.  She had an episode of chest pain back in December as well as in February.  She was admitted for 1 day in 2010-11-27 for chest pain and had a negative Myoview stress test at that time.     She saw Finis Bud nurse practitioner who she described several episodes of chest pain to.  Based on this a heart cath was recommended however the patient never pursued this.  She is back here in follow-up.  Based on her symptoms, risk factors and CT results I recommended that she proceed with outpatient radial diagnostic coronary catheterization.  I performed outpatient radial diagnostic cath on her 05/17/2022 which revealed mild nonobstructive disease.  Since that time her chest pain has resolved.  Her lipid profile does however show significant hypertriglyceridemia with a triglyceride level of 457.   Current Meds  Medication Sig   Alirocumab 75 MG/ML SOAJ Inject 1 mL into the skin every 14 (fourteen) days. (Patient taking differently: Inject 75 mg into the skin every 14 (fourteen) days.)    amLODipine (NORVASC) 5 MG tablet Take 5 mg by mouth daily.   aspirin EC 81 MG tablet Take 1 tablet (81 mg total) by mouth daily. Swallow whole.   Coenzyme Q10 100 MG capsule Take 100 mg by mouth daily.   Continuous Blood Gluc Sensor (FREESTYLE LIBRE 2 SENSOR) MISC by Does not apply route.   dapagliflozin propanediol (FARXIGA) 5 MG TABS tablet Take 5 mg by mouth daily.   escitalopram (LEXAPRO) 5 MG tablet Take 5 mg by mouth daily.   ezetimibe (ZETIA) 10 MG tablet Take 1 tablet (10 mg total) by mouth daily.   fluticasone (FLONASE) 50 MCG/ACT nasal spray Place 1 spray into both nostrils daily.   gabapentin (NEURONTIN) 300 MG capsule Take 300 mg by mouth at bedtime.   metFORMIN (GLUCOPHAGE) 500 MG tablet Take 1,000 mg by mouth 2 (two) times daily.   methylphenidate (METADATE CD) 40 MG CR capsule Take 20 mg by mouth every morning.   metoprolol succinate (TOPROL-XL) 50 MG 24 hr tablet Take 50 mg by mouth at bedtime.   Multiple Vitamins-Minerals (MULTIVITAMIN WITH MINERALS) tablet Take 1 tablet by mouth daily.   nitroGLYCERIN (NITROSTAT) 0.4 MG SL tablet Place 1 tablet (0.4 mg total) under the tongue every 5 (five) minutes as needed for chest pain.   ondansetron (ZOFRAN) 4 MG tablet Take 1 tablet (4 mg total) by mouth every 8 (eight) hours as needed for nausea or vomiting.   RABEprazole (ACIPHEX) 20  MG tablet Take 20 mg by mouth daily.   telmisartan-hydrochlorothiazide (MICARDIS HCT) 80-12.5 MG tablet Take 1 tablet by mouth daily.   tirzepatide (MOUNJARO) 12.5 MG/0.5ML Pen Inject 12.5 mg into the skin once a week.   [DISCONTINUED] tirzepatide (MOUNJARO) 10 MG/0.5ML Pen Inject 10 mg into the skin once a week. (Patient taking differently: Inject 10 mg into the skin every Tuesday.)   Current Facility-Administered Medications for the 05/26/22 encounter (Office Visit) with Lorretta Harp, MD  Medication   sodium chloride flush (NS) 0.9 % injection 3 mL     Allergies  Allergen Reactions   Actos  [Pioglitazone] Other (See Comments)    bloated   Crestor [Rosuvastatin]     myalgias   Pravachol [Pravastatin]     Other reaction(s): had muscle cramps   Quinapril Hcl     Other reaction(s): Unknown   Trulicity [Dulaglutide] Other (See Comments)    Other reaction(s): nausea with trulicity    Social History   Socioeconomic History   Marital status: Married    Spouse name: Not on file   Number of children: 1   Years of education: Not on file   Highest education level: Not on file  Occupational History   Not on file  Tobacco Use   Smoking status: Never   Smokeless tobacco: Never  Vaping Use   Vaping Use: Never used  Substance and Sexual Activity   Alcohol use: No   Drug use: No   Sexual activity: Not on file  Other Topics Concern   Not on file  Social History Narrative   Not on file   Social Determinants of Health   Financial Resource Strain: Not on file  Food Insecurity: Not on file  Transportation Needs: Not on file  Physical Activity: Not on file  Stress: Not on file  Social Connections: Not on file  Intimate Partner Violence: Not on file     Review of Systems: General: negative for chills, fever, night sweats or weight changes.  Cardiovascular: negative for chest pain, dyspnea on exertion, edema, orthopnea, palpitations, paroxysmal nocturnal dyspnea or shortness of breath Dermatological: negative for rash Respiratory: negative for cough or wheezing Urologic: negative for hematuria Abdominal: negative for nausea, vomiting, diarrhea, bright red blood per rectum, melena, or hematemesis Neurologic: negative for visual changes, syncope, or dizziness All other systems reviewed and are otherwise negative except as noted above.    Blood pressure 118/66, pulse 74, height '5\' 7"'$  (1.702 m), weight 181 lb (82.1 kg).  General appearance: alert and no distress Neck: no adenopathy, no carotid bruit, no JVD, supple, symmetrical, trachea midline, and thyroid not enlarged,  symmetric, no tenderness/mass/nodules Lungs: clear to auscultation bilaterally Heart: regular rate and rhythm, S1, S2 normal, no murmur, click, rub or gallop Extremities: extremities normal, atraumatic, no cyanosis or edema Pulses: 2+ and symmetric Skin: Skin color, texture, turgor normal. No rashes or lesions Neurologic: Grossly normal  EKG not performed today  ASSESSMENT AND PLAN:   Hyperlipidemia History of hyperlipidemia on Praluent with lipid profile performed 05/14/2022 revealing total cholesterol 188, LDL of 80 and HDL of 35.  Her her triglyceride level was 457.  I am referring her to Dr. Debara Pickett in lipid clinic for further evaluation and treatment of hypertriglyceridemia.  Essential hypertension History of essential hypertension a blood pressure measured today at 118/66.  She is on amlodipine, metoprolol, Micardis and hydrochlorothiazide.  Abnormal findings diagnostic imaging of heart and coronary circulation Patient had coronary CTA that suggested a RCA lesion  01/08/2022.  Outpatient cardiac catheterization performed by myself 05/17/2022 showed mild nonobstructive disease.  She no longer has chest pain.     Lorretta Harp MD FACP,FACC,FAHA, [facility] 05/26/2022 11:32 AM

## 2022-06-02 ENCOUNTER — Other Ambulatory Visit (HOSPITAL_COMMUNITY): Payer: Self-pay

## 2022-06-04 ENCOUNTER — Other Ambulatory Visit (HOSPITAL_COMMUNITY): Payer: Self-pay

## 2022-06-04 DIAGNOSIS — G4733 Obstructive sleep apnea (adult) (pediatric): Secondary | ICD-10-CM | POA: Diagnosis not present

## 2022-06-07 ENCOUNTER — Other Ambulatory Visit (HOSPITAL_COMMUNITY): Payer: Self-pay

## 2022-06-15 ENCOUNTER — Other Ambulatory Visit (HOSPITAL_COMMUNITY): Payer: Self-pay

## 2022-06-17 DIAGNOSIS — L910 Hypertrophic scar: Secondary | ICD-10-CM | POA: Diagnosis not present

## 2022-06-17 DIAGNOSIS — L821 Other seborrheic keratosis: Secondary | ICD-10-CM | POA: Diagnosis not present

## 2022-06-17 DIAGNOSIS — D1739 Benign lipomatous neoplasm of skin and subcutaneous tissue of other sites: Secondary | ICD-10-CM | POA: Diagnosis not present

## 2022-06-17 DIAGNOSIS — L720 Epidermal cyst: Secondary | ICD-10-CM | POA: Diagnosis not present

## 2022-06-22 DIAGNOSIS — G4733 Obstructive sleep apnea (adult) (pediatric): Secondary | ICD-10-CM | POA: Diagnosis not present

## 2022-07-04 DIAGNOSIS — G4733 Obstructive sleep apnea (adult) (pediatric): Secondary | ICD-10-CM | POA: Diagnosis not present

## 2022-07-07 ENCOUNTER — Encounter: Payer: Self-pay | Admitting: Gastroenterology

## 2022-07-07 ENCOUNTER — Other Ambulatory Visit (HOSPITAL_COMMUNITY): Payer: Self-pay

## 2022-07-07 DIAGNOSIS — Z01419 Encounter for gynecological examination (general) (routine) without abnormal findings: Secondary | ICD-10-CM | POA: Diagnosis not present

## 2022-07-07 DIAGNOSIS — Z1231 Encounter for screening mammogram for malignant neoplasm of breast: Secondary | ICD-10-CM | POA: Diagnosis not present

## 2022-07-07 DIAGNOSIS — Z1272 Encounter for screening for malignant neoplasm of vagina: Secondary | ICD-10-CM | POA: Diagnosis not present

## 2022-07-07 DIAGNOSIS — Z6828 Body mass index (BMI) 28.0-28.9, adult: Secondary | ICD-10-CM | POA: Diagnosis not present

## 2022-07-21 ENCOUNTER — Ambulatory Visit: Payer: 59 | Admitting: Cardiovascular Disease

## 2022-07-26 ENCOUNTER — Ambulatory Visit (AMBULATORY_SURGERY_CENTER): Payer: 59

## 2022-07-26 VITALS — Ht 67.0 in | Wt 169.0 lb

## 2022-07-26 DIAGNOSIS — Z86010 Personal history of colonic polyps: Secondary | ICD-10-CM

## 2022-07-26 NOTE — Progress Notes (Signed)
Pre visit completed via phone call; Patient verified name, DOB, and address;  No egg or soy allergy known to patient;  No issues known to pt with past sedation with any surgeries or procedures----other than PONV; Patient denies ever being told they had issues or difficulty with intubation---other than PONV; No FH of Malignant Hyperthermia; Pt is not on diet pills; Pt is not on home 02;  Pt is not on blood thinners;  Pt denies issues with constipation=encouraged to increase oral fluids, take OTC stool softeners or "last resort"- laxatives, increase activity;   No A fib or A flutter; Have any cardiac testing pending--NO Pt instructed to use Singlecare.com or GoodRx for a price reduction on prep;   Insurance verified during [place] appt=[employer]  Patient's chart reviewed by Osvaldo Angst CNRA prior to previsit and patient appropriate for the [facility].  Previsit completed and red dot placed by patient's name on their procedure day (on provider's schedule);    Patient requested Plenvu prep as she has this prep at home; instructions sent to patient via MyChart and mail per patient request;  Plenvu Rx not sent;

## 2022-08-02 ENCOUNTER — Other Ambulatory Visit (HOSPITAL_COMMUNITY): Payer: Self-pay

## 2022-08-02 ENCOUNTER — Telehealth: Payer: Self-pay | Admitting: Cardiovascular Disease

## 2022-08-02 NOTE — Telephone Encounter (Signed)
*  STAT* If patient is at the pharmacy, call can be transferred to refill team.   1. Which medications need to be refilled? (please list name of each medication and dose if known)  Praluent  2. Which pharmacy/location (including street and city if local pharmacy) is medication to be sent to?  [place]  3. Do they need a 30 day or 90 day supply?   30 day supply  Patient states she is due for her next injection on 11/29 and the pharmacy is needing a PA.

## 2022-08-03 ENCOUNTER — Other Ambulatory Visit (HOSPITAL_COMMUNITY): Payer: Self-pay

## 2022-08-03 NOTE — Telephone Encounter (Unsigned)
PA for Praluent sent. Key: [id number]

## 2022-08-04 DIAGNOSIS — G4733 Obstructive sleep apnea (adult) (pediatric): Secondary | ICD-10-CM | POA: Diagnosis not present

## 2022-08-06 ENCOUNTER — Other Ambulatory Visit (HOSPITAL_COMMUNITY): Payer: Self-pay

## 2022-08-06 MED ORDER — METHYLPHENIDATE HCL ER (CD) 20 MG PO CPCR
20.0000 mg | ORAL_CAPSULE | Freq: Every morning | ORAL | 0 refills | Status: DC
Start: 2022-08-06 — End: 2022-09-10
  Filled 2022-08-06: qty 30, 30d supply, fill #0

## 2022-08-07 ENCOUNTER — Encounter: Payer: Self-pay | Admitting: Certified Registered Nurse Anesthetist

## 2022-08-07 ENCOUNTER — Encounter: Payer: Self-pay | Admitting: Gastroenterology

## 2022-08-10 ENCOUNTER — Encounter: Payer: Self-pay | Admitting: Gastroenterology

## 2022-08-10 ENCOUNTER — Other Ambulatory Visit (HOSPITAL_COMMUNITY): Payer: Self-pay

## 2022-08-10 ENCOUNTER — Ambulatory Visit (AMBULATORY_SURGERY_CENTER): Payer: 59 | Admitting: Gastroenterology

## 2022-08-10 VITALS — BP 135/93 | HR 67 | Temp 97.7°F | Resp 15 | Ht 67.0 in | Wt 169.0 lb

## 2022-08-10 DIAGNOSIS — Z86010 Personal history of colonic polyps: Secondary | ICD-10-CM

## 2022-08-10 DIAGNOSIS — Z09 Encounter for follow-up examination after completed treatment for conditions other than malignant neoplasm: Secondary | ICD-10-CM | POA: Diagnosis not present

## 2022-08-10 DIAGNOSIS — D122 Benign neoplasm of ascending colon: Secondary | ICD-10-CM

## 2022-08-10 MED ORDER — SODIUM CHLORIDE 0.9 % IV SOLN: 500.0000 mL | Freq: Once | INTRAVENOUS | Status: DC

## 2022-08-10 NOTE — Progress Notes (Signed)
VS completed by DT.  Pt's states no medical or surgical changes since previsit or office visit.  

## 2022-08-10 NOTE — Op Note (Signed)
[place] Patient Name: Alexis Black Procedure Date: 08/10/2022 11:48 AM MRN: [id number] Endoscopist: Ladene Artist , MD, [id number] Age: 61 Referring MD:  Date of Birth: 25-Sep-1960 Gender: Female Account #: [id number] Procedure:                Colonoscopy Indications:              Surveillance: Personal history of adenomatous                            polyps on last colonoscopy 5 years ago Medicines:                Monitored Anesthesia Care Procedure:                Pre-Anesthesia Assessment:                           - Prior to the procedure, a History and Physical                            was performed, and patient medications and                            allergies were reviewed. The patient's tolerance of                            previous anesthesia was also reviewed. The risks                            and benefits of the procedure and the sedation                            options and risks were discussed with the patient.                            All questions were answered, and informed consent                            was obtained. Prior Anticoagulants: The patient has                            taken no anticoagulant or antiplatelet agents. ASA                            Grade Assessment: II - A patient with mild systemic                            disease. After reviewing the risks and benefits,                            the patient was deemed in satisfactory condition to                            undergo the procedure.  After obtaining informed consent, the colonoscope                            was passed under direct vision. Throughout the                            procedure, the patient's blood pressure, pulse, and                            oxygen saturations were monitored continuously. The                            CF [id number] #[id number] was introduced through the anus                            and advanced to the  the cecum, identified by                            appendiceal orifice and ileocecal valve. The                            ileocecal valve, appendiceal orifice, and rectum                            were photographed. The quality of the bowel                            preparation was excellent. The colonoscopy was                            performed without difficulty. The patient tolerated                            the procedure well. Scope In: 11:52:37 AM Scope Out: 12:07:25 PM Scope Withdrawal Time: 0 hours 11 minutes 23 seconds  Total Procedure Duration: 0 hours 14 minutes 48 seconds  Findings:                 The perianal and digital rectal examinations were                            normal.                           A 3 mm polyp was found in the ascending colon. The                            polyp was sessile. The polyp was removed with a                            cold biopsy forceps. Resection and retrieval were                            complete.  Internal hemorrhoids were found during                            retroflexion. The hemorrhoids were small and Grade                            I (internal hemorrhoids that do not prolapse).                           The exam was otherwise without abnormality on                            direct and retroflexion views. Complications:            No immediate complications. Estimated blood loss:                            None. Estimated Blood Loss:     Estimated blood loss: none. Impression:               - One 3 mm polyp in the ascending colon, removed                            with a cold biopsy forceps. Resected and retrieved.                           - Internal hemorrhoids.                           - The examination was otherwise normal on direct                            and retroflexion views. Recommendation:           - Repeat colonoscopy date to be determined after                             pending pathology results are reviewed for                            surveillance based on pathology results.                           - Patient has a contact number available for                            emergencies. The signs and symptoms of potential                            delayed complications were discussed with the                            patient. Return to normal activities tomorrow.                            Written discharge instructions were provided to the  patient.                           - Resume previous diet.                           - Continue present medications.                           - Await pathology results. Ladene Artist, MD 08/10/2022 12:09:46 PM This report has been signed electronically.

## 2022-08-10 NOTE — Progress Notes (Signed)
Report given to PACU, vss 

## 2022-08-10 NOTE — Patient Instructions (Signed)
Handouts on hemorrhoids and polyps given to patient. Await pathology results. Resume previous diet and continue present medications. Repeat colonoscopy for surveillance will be determined based off of pathology results.   YOU HAD AN ENDOSCOPIC PROCEDURE TODAY AT [facility]:   Refer to the procedure report that was given to you for any specific questions about what was found during the examination.  If the procedure report does not answer your questions, please call your gastroenterologist to clarify.  If you requested that your care partner not be given the details of your procedure findings, then the procedure report has been included in a sealed envelope for you to review at your convenience later.  YOU SHOULD EXPECT: Some feelings of bloating in the abdomen. Passage of more gas than usual.  Walking can help get rid of the air that was put into your GI tract during the procedure and reduce the bloating. If you had a lower endoscopy (such as a colonoscopy or flexible sigmoidoscopy) you may notice spotting of blood in your stool or on the toilet paper. If you underwent a bowel prep for your procedure, you may not have a normal bowel movement for a few days.  Please Note:  You might notice some irritation and congestion in your nose or some drainage.  This is from the oxygen used during your procedure.  There is no need for concern and it should clear up in a day or so.  SYMPTOMS TO REPORT IMMEDIATELY:  Following lower endoscopy (colonoscopy or flexible sigmoidoscopy):  Excessive amounts of blood in the stool  Significant tenderness or worsening of abdominal pains  Swelling of the abdomen that is new, acute  Fever of 100F or higher  For urgent or emergent issues, a gastroenterologist can be reached at any hour by calling [phone]. Do not use MyChart messaging for urgent concerns.    DIET:  We do recommend a small meal at first, but then you may proceed to your regular  diet.  Drink plenty of fluids but you should avoid alcoholic beverages for 24 hours.  ACTIVITY:  You should plan to take it easy for the rest of today and you should NOT DRIVE or use heavy machinery until tomorrow (because of the sedation medicines used during the test).    FOLLOW UP: Our staff will call the number listed on your records the next business day following your procedure.  We will call around 7:15- 8:00 am to check on you and address any questions or concerns that you may have regarding the information given to you following your procedure. If we do not reach you, we will leave a message.     If any biopsies were taken you will be contacted by phone or by letter within the next 1-3 weeks.  Please call us at [phone] if you have not heard about the biopsies in 3 weeks.    SIGNATURES/CONFIDENTIALITY: You and/or your care partner have signed paperwork which will be entered into your electronic medical record.  These signatures attest to the fact that that the information above on your After Visit Summary has been reviewed and is understood.  Full responsibility of the confidentiality of this discharge information lies with you and/or your care-partner.

## 2022-08-10 NOTE — Progress Notes (Signed)
History & Physical  Primary Care Physician:  Donnajean Lopes, MD Primary Gastroenterologist: Lucio Edward, MD  CHIEF COMPLAINT:  Personal history of colon polyps   HPI: Alexis Black is a 61 y.o. female with a personal history of adenomatous colon polyps on colonoscopy in 2018 and a tubulovillous adenoma in 2010 for surveillance colonoscopy.   Past Medical History:  Diagnosis Date   Blood transfusion without reported diagnosis 1998   Chronic kidney disease    no stage- on preventative meds   Deafness in left ear    DM Type 2    on meds   Family history of adverse reaction to anesthesia    mother - PONV   GERD (gastroesophageal reflux disease)    on meds   Heart palpitations    "with caffeine" occ has   History of bronchitis    last time 02-2021   History of COVID-19 02/2021   bronchitis symptoms x 4 to 5 days all symptoms resolved   History of hiatal hernia    Numbness and tingling of leg    burning both feet @ times   PONV (postoperative nausea and vomiting)    S/P insertion of spinal cord stimulator    Seasonal allergies    Shortness of breath dyspnea    "with caffiene"    Sleep apnea    uses CPAP   Wears glasses    Wears hearing aid in left ear 09/16/2021   sometimes wears  per pt    Past Surgical History:  Procedure Laterality Date   ABDOMINAL HYSTERECTOMY     with right ovary removed over 30 yrs ago per pt on 09-16-2021   [place], 2000 lower   CARPAL TUNNEL RELEASE Right 09/21/2021   Procedure: Right Carpal Tunnel Release + Right Dequervains first dorsal compartment release;  Surgeon: [name], MD;  Location: [facility];  Service: Orthopedics;  Laterality: Right;  with local anesthesia   [place]  2018   MS-MAC-prep good-int hems-TA x 1=5 yr recall   ESOPHAGOGASTRODUODENOSCOPY  10/2020   INNER EAR SURGERY Left 1983   LEFT HEART CATH AND CORONARY ANGIOGRAPHY  N/A 05/17/2022   Procedure: LEFT HEART CATH AND CORONARY ANGIOGRAPHY;  Surgeon: Lorretta Harp, MD;  Location: [facility];  Service: Cardiovascular;  Laterality: N/A;   left salpingo-oophorectomy     yrs ago after hysterectomy per pt on 09-28-2021   PLANTAR FASCIA SURGERY Bilateral    over 15 yrs ago per pt on 09-17-2021   ROTATOR CUFF REPAIR Bilateral    over 10 yrs ago   [place] N/A 02/15/2022   Procedure: SPINAL CORD STIMULATOR BATTERY EXCHANGE;  Surgeon: Melina Schools, MD;  Location: [facility];  Service: Orthopedics;  Laterality: N/A;   SPINAL CORD STIMULATOR INSERTION N/A 05/13/2016   Procedure: LUMBAR SPINAL CORD STIMULATOR INSERTION;  Surgeon: Melina Schools, MD;  Location: [facility];  Service: Orthopedics;  Laterality: N/A;   UPPER GASTROINTESTINAL ENDOSCOPY  2018    Prior to Admission medications   Medication Sig Start Date End Date Taking? Authorizing Provider  amLODipine (NORVASC) 5 MG tablet Take 5 mg by mouth daily. 03/27/18  Yes [provider]  Coenzyme Q10 100 MG capsule Take 100 mg by mouth daily.   Yes [provider]  dapagliflozin propanediol (FARXIGA) 5 MG TABS tablet Take 5 mg by mouth daily.  Yes [provider]  escitalopram (LEXAPRO) 5 MG tablet Take 5 mg by mouth daily.   Yes [provider]  gabapentin (NEURONTIN) 300 MG capsule Take 300 mg by mouth at bedtime. 06/13/20  Yes [provider]  lidocaine (LIDODERM) 5 % Place 1 patch onto the skin daily.   Yes [provider]  metFORMIN (GLUCOPHAGE) 500 MG tablet Take 1,000 mg by mouth 2 (two) times daily. 01/13/22  Yes [provider]  methylphenidate (METADATE CD) 20 MG CR capsule Take 1 capsule (20 mg total) by mouth before breakfast in the morning. 08/06/22  Yes   metoprolol succinate (TOPROL-XL) 50 MG 24 hr tablet Take 50 mg by mouth at bedtime. 01/21/22  Yes [provider]  Multiple Vitamins-Minerals  (MULTIVITAMIN WITH MINERALS) tablet Take 1 tablet by mouth daily.   Yes [provider]  progesterone (PROMETRIUM) 100 MG capsule Take 100 mg by mouth at bedtime. 08/04/22  Yes [provider]  RABEprazole (ACIPHEX) 20 MG tablet Take 20 mg by mouth daily.   Yes [provider]  telmisartan-hydrochlorothiazide (MICARDIS HCT) 80-12.5 MG tablet Take 1 tablet by mouth daily.   Yes [provider]  Alirocumab 75 MG/ML SOAJ Inject 1 mL into the skin every 14 (fourteen) days. Patient taking differently: Inject 75 mg into the skin every 14 (fourteen) days. 04/30/22   Lorretta Harp, MD  Continuous Blood Gluc Sensor (FREESTYLE LIBRE 2 SENSOR) MISC by Does not apply route.    [provider]  estradiol (ESTRACE) 0.1 MG/GM vaginal cream Place 1 Applicatorful vaginally 2 (two) times a week. 07/07/22   [provider]  HYDROcodone bit-homatropine (HYCODAN) 5-1.5 MG/5ML syrup Take 5 mLs by mouth every 6 (six) hours as needed. 07/28/22   [provider]  methylphenidate (METADATE CD) 20 MG CR capsule Take 20 mg by mouth every morning. 08/13/20   [provider]  ondansetron (ZOFRAN) 4 MG tablet Take 1 tablet (4 mg total) by mouth every 8 (eight) hours as needed for nausea or vomiting. 02/15/22   Melina Schools, MD  tirzepatide [facility]) 12.5 MG/0.5ML Pen Inject 12.5 mg into the skin once a week. 05/14/22       Current Outpatient Medications  Medication Sig Dispense Refill   amLODipine (NORVASC) 5 MG tablet Take 5 mg by mouth daily.  12   Coenzyme Q10 100 MG capsule Take 100 mg by mouth daily.     dapagliflozin propanediol (FARXIGA) 5 MG TABS tablet Take 5 mg by mouth daily.     escitalopram (LEXAPRO) 5 MG tablet Take 5 mg by mouth daily.     gabapentin (NEURONTIN) 300 MG capsule Take 300 mg by mouth at bedtime.     lidocaine (LIDODERM) 5 % Place 1 patch onto the skin daily.     metFORMIN (GLUCOPHAGE) 500 MG tablet Take 1,000 mg by mouth 2  (two) times daily.     methylphenidate (METADATE CD) 20 MG CR capsule Take 1 capsule (20 mg total) by mouth before breakfast in the morning. 30 capsule 0   metoprolol succinate (TOPROL-XL) 50 MG 24 hr tablet Take 50 mg by mouth at bedtime.     Multiple Vitamins-Minerals (MULTIVITAMIN WITH MINERALS) tablet Take 1 tablet by mouth daily.     progesterone (PROMETRIUM) 100 MG capsule Take 100 mg by mouth at bedtime.     RABEprazole (ACIPHEX) 20 MG tablet Take 20 mg by mouth daily.     telmisartan-hydrochlorothiazide (MICARDIS HCT) 80-12.5 MG tablet Take 1 tablet  by mouth daily.     Alirocumab 75 MG/ML SOAJ Inject 1 mL into the skin every 14 (fourteen) days. (Patient taking differently: Inject 75 mg into the skin every 14 (fourteen) days.) 6 mL 3   Continuous Blood Gluc Sensor (FREESTYLE LIBRE 2 SENSOR) MISC by Does not apply route.     estradiol (ESTRACE) 0.1 MG/GM vaginal cream Place 1 Applicatorful vaginally 2 (two) times a week.     HYDROcodone bit-homatropine (HYCODAN) 5-1.5 MG/5ML syrup Take 5 mLs by mouth every 6 (six) hours as needed.     methylphenidate (METADATE CD) 20 MG CR capsule Take 20 mg by mouth every morning.     ondansetron (ZOFRAN) 4 MG tablet Take 1 tablet (4 mg total) by mouth every 8 (eight) hours as needed for nausea or vomiting. 20 tablet 0   tirzepatide (MOUNJARO) 12.5 MG/0.5ML Pen Inject 12.5 mg into the skin once a week. 6 mL 3   Current Facility-Administered Medications  Medication Dose Route Frequency Provider Last Rate Last Admin   0.9 %  sodium chloride infusion  500 mL Intravenous Once Lucio Edward T, MD       sodium chloride flush (NS) 0.9 % injection 3 mL  3 mL Intravenous Q12H Lorretta Harp, MD        Allergies as of 08/10/2022 - Review Complete 08/10/2022  Allergen Reaction Noted   Actos [pioglitazone] Other (See Comments) 05/06/2016   Crestor [rosuvastatin]  01/05/2022   Pravachol [pravastatin]  06/24/2020   Quinapril hcl  16/06/9603   Trulicity  [dulaglutide] Other (See Comments) 06/24/2020    Family History  Problem Relation Age of Onset   Diabetes Mother        Carcinomoid tumors all over   Heart disease Mother    Diabetes Father    Colon cancer Neg Hx    Colon polyps Neg Hx    Esophageal cancer Neg Hx    Rectal cancer Neg Hx    Stomach cancer Neg Hx     Social History   Socioeconomic History   Marital status: Married    Spouse name: Not on file   Number of children: 1   Years of education: Not on file   Highest education level: Not on file  Occupational History   Not on file  Tobacco Use   Smoking status: Never   Smokeless tobacco: Never  Vaping Use   Vaping Use: Never used  Substance and Sexual Activity   Alcohol use: No   Drug use: No   Sexual activity: Not on file  Other Topics Concern   Not on file  Social History Narrative   Not on file   Social Determinants of Health   Financial Resource Strain: Not on file  Food Insecurity: Not on file  Transportation Needs: Not on file  Physical Activity: Not on file  Stress: Not on file  Social Connections: Not on file  Intimate Partner Violence: Not on file    Review of Systems:  All systems reviewed were negative except where noted in HPI.   Physical Exam: General:  Alert, well-developed, in NAD Head:  Normocephalic and atraumatic. Eyes:  Sclera clear, no icterus.   Conjunctiva pink. Ears:  Normal auditory acuity. Mouth:  No deformity or lesions.  Neck:  Supple; no masses . Lungs:  Clear throughout to auscultation.   No wheezes, crackles, or rhonchi. No acute distress. Heart:  Regular rate and rhythm; no murmurs. Abdomen:  Soft, nondistended, nontender. No masses, hepatomegaly. No obvious  masses.  Normal bowel .    Rectal:  Deferred   Msk:  Symmetrical without gross deformities.. Pulses:  Normal pulses noted. Extremities:  Without edema. Neurologic:  Alert and  oriented x4;  grossly normal neurologically. Skin:  Intact without significant  lesions or rashes. Psych:  Alert and cooperative. Normal mood and affect.  Impression / Plan:   Personal history of adenomatous colon polyps on colonoscopy in 2018 and a tubulovillous adenoma in 2010 for surveillance colonoscopy.  Pricilla Riffle. [place] Plan  08/10/2022, 11:42 AM See Shea Evans,  GI, to contact our on call provider

## 2022-08-11 ENCOUNTER — Telehealth: Payer: Self-pay | Admitting: *Deleted

## 2022-08-11 NOTE — Telephone Encounter (Signed)
  Follow up Call-     08/10/2022   11:06 AM 11/04/2020    9:29 AM  Call back number  Post procedure Call Back phone  # [phone]  Permission to leave phone message Yes Yes     Patient questions:  Do you have a fever, pain , or abdominal swelling? No. Pain Score  0 *  Have you tolerated food without any problems? Yes.    Have you been able to return to your normal activities? Yes.    Do you have any questions about your discharge instructions: Diet   No. Medications  No. Follow up visit  No.  Do you have questions or concerns about your Care? No.  Actions: * If pain score is 4 or above: No action needed, pain <4.

## 2022-08-12 ENCOUNTER — Other Ambulatory Visit: Payer: Self-pay | Admitting: Otolaryngology

## 2022-08-12 ENCOUNTER — Ambulatory Visit
Admission: RE | Admit: 2022-08-12 | Discharge: 2022-08-12 | Disposition: A | Discharge status: Home / Self Care | Payer: 59 | Source: Ambulatory Visit | Attending: Otolaryngology | Admitting: Otolaryngology

## 2022-08-12 DIAGNOSIS — R053 Chronic cough: Secondary | ICD-10-CM | POA: Diagnosis not present

## 2022-08-12 DIAGNOSIS — J0100 Acute maxillary sinusitis, unspecified: Secondary | ICD-10-CM | POA: Diagnosis not present

## 2022-08-12 DIAGNOSIS — J209 Acute bronchitis, unspecified: Secondary | ICD-10-CM | POA: Diagnosis not present

## 2022-08-12 DIAGNOSIS — R059 Cough, unspecified: Secondary | ICD-10-CM | POA: Diagnosis not present

## 2022-08-13 ENCOUNTER — Telehealth: Payer: Self-pay | Admitting: Cardiovascular Disease

## 2022-08-13 DIAGNOSIS — E782 Mixed hyperlipidemia: Secondary | ICD-10-CM

## 2022-08-13 DIAGNOSIS — I2510 Atherosclerotic heart disease of native coronary artery without angina pectoris: Secondary | ICD-10-CM

## 2022-08-13 NOTE — Telephone Encounter (Signed)
Returned call to patient who states that her PA for Praulent has been denied due to lab work not backing up that she needs the medication. Patient states that she has not had this medication in 3 weeks. Advised patient that she may need repeat labs as last labs are 3 months ago but that I would forward message over to our PharmD for them to review and advise. Patient verbalized understanding.

## 2022-08-13 NOTE — Telephone Encounter (Signed)
Pt c/o medication issue:  1. Name of Medication:  Praulent  2. How are you currently taking this medication (dosage and times per day)?   3. Are you having a reaction (difficulty breathing--STAT)?   4. What is your medication issue? Pt calling for an update on prior authorization for her Praulent

## 2022-08-13 NOTE — Telephone Encounter (Signed)
PA resubmitted with most recent lab work.   Key: [id number]

## 2022-08-13 NOTE — Telephone Encounter (Signed)
Pt c/o medication issue:  1. Name of Medication: Praluent   2. How are you currently taking this medication (dosage and times per day)?   3. Are you having a reaction (difficulty breathing--STAT)?   4. What is your medication issue? Pt is requesting call back to discuss this medication due to it not being approved by insurance. She says that the medication was not backed by the labs. She would like a call back to discuss.

## 2022-08-16 ENCOUNTER — Other Ambulatory Visit (HOSPITAL_COMMUNITY): Payer: Self-pay

## 2022-08-18 ENCOUNTER — Other Ambulatory Visit (HOSPITAL_COMMUNITY): Payer: Self-pay

## 2022-08-19 ENCOUNTER — Other Ambulatory Visit (HOSPITAL_COMMUNITY): Payer: Self-pay

## 2022-08-19 MED ORDER — ALIROCUMAB 75 MG/ML ~~LOC~~ SOAJ
1.0000 mL | SUBCUTANEOUS | 3 refills | Status: DC
  Filled 2022-08-19: qty 6, 84d supply, fill #0
  Filled 2022-09-11: qty 2, 28d supply, fill #0
  Filled 2022-10-12: qty 2, 28d supply, fill #1
  Filled 2022-11-07: qty 2, 28d supply, fill #2
  Filled 2022-12-05: qty 2, 28d supply, fill #3
  Filled 2023-01-10: qty 2, 28d supply, fill #4
  Filled 2023-02-01: qty 2, 28d supply, fill #5
  Filled 2023-03-05: qty 2, 28d supply, fill #6
  Filled 2023-04-06: qty 2, 28d supply, fill #7

## 2022-08-19 NOTE — Addendum Note (Signed)
Addended by: Rollen Sox on: 08/19/2022 12:04 PM   Modules accepted: Orders

## 2022-08-19 NOTE — Telephone Encounter (Signed)
PA for Praluent approved through 08/17/23

## 2022-08-20 DIAGNOSIS — E1129 Type 2 diabetes mellitus with other diabetic kidney complication: Secondary | ICD-10-CM | POA: Diagnosis not present

## 2022-08-20 DIAGNOSIS — R809 Proteinuria, unspecified: Secondary | ICD-10-CM | POA: Diagnosis not present

## 2022-08-20 DIAGNOSIS — Z794 Long term (current) use of insulin: Secondary | ICD-10-CM | POA: Diagnosis not present

## 2022-08-23 ENCOUNTER — Encounter: Payer: Self-pay | Admitting: Gastroenterology

## 2022-08-31 ENCOUNTER — Other Ambulatory Visit: Payer: Self-pay

## 2022-08-31 ENCOUNTER — Other Ambulatory Visit (HOSPITAL_COMMUNITY): Payer: Self-pay

## 2022-09-10 ENCOUNTER — Other Ambulatory Visit (HOSPITAL_COMMUNITY): Payer: Self-pay

## 2022-09-10 MED ORDER — METHYLPHENIDATE HCL ER (CD) 20 MG PO CPCR
20.0000 mg | ORAL_CAPSULE | Freq: Every day | ORAL | 0 refills | Status: DC
  Filled 2022-09-10: qty 30, 30d supply, fill #0

## 2022-09-13 ENCOUNTER — Other Ambulatory Visit (HOSPITAL_COMMUNITY): Payer: Self-pay

## 2022-09-14 ENCOUNTER — Other Ambulatory Visit (HOSPITAL_COMMUNITY): Payer: Self-pay

## 2022-10-12 ENCOUNTER — Other Ambulatory Visit (HOSPITAL_COMMUNITY): Payer: Self-pay

## 2022-10-13 ENCOUNTER — Other Ambulatory Visit (HOSPITAL_COMMUNITY): Payer: Self-pay

## 2022-10-13 ENCOUNTER — Ambulatory Visit: Payer: 59 | Attending: Cardiovascular Disease | Admitting: Internal Medicine

## 2022-10-13 ENCOUNTER — Telehealth: Payer: Self-pay

## 2022-10-13 ENCOUNTER — Other Ambulatory Visit: Payer: Self-pay

## 2022-10-13 ENCOUNTER — Encounter: Payer: Self-pay | Admitting: Internal Medicine

## 2022-10-13 VITALS — Ht 68.0 in | Wt 175.0 lb

## 2022-10-13 DIAGNOSIS — T466X5A Adverse effect of antihyperlipidemic and antiarteriosclerotic drugs, initial encounter: Secondary | ICD-10-CM | POA: Diagnosis not present

## 2022-10-13 DIAGNOSIS — E781 Pure hyperglyceridemia: Secondary | ICD-10-CM | POA: Diagnosis not present

## 2022-10-13 DIAGNOSIS — I25119 Atherosclerotic heart disease of native coronary artery with unspecified angina pectoris: Secondary | ICD-10-CM | POA: Diagnosis not present

## 2022-10-13 DIAGNOSIS — E785 Hyperlipidemia, unspecified: Secondary | ICD-10-CM | POA: Diagnosis not present

## 2022-10-13 DIAGNOSIS — M791 Myalgia, unspecified site: Secondary | ICD-10-CM | POA: Diagnosis not present

## 2022-10-13 MED ORDER — MOUNJARO 12.5 MG/0.5ML ~~LOC~~ SOAJ
12.5000 mg | SUBCUTANEOUS | 3 refills | Status: DC
  Filled 2022-10-13 – 2022-12-15 (×2): qty 2, 28d supply, fill #0

## 2022-10-13 MED ORDER — MOUNJARO 15 MG/0.5ML ~~LOC~~ SOAJ
15.0000 mg | SUBCUTANEOUS | 3 refills | Status: AC
  Filled 2022-10-13: qty 2, 28d supply, fill #0
  Filled 2022-11-07: qty 2, 28d supply, fill #1
  Filled 2022-12-05 – 2023-03-03 (×2): qty 2, 28d supply, fill #2

## 2022-10-13 MED ORDER — ICOSAPENT ETHYL 1 G PO CAPS
2.0000 g | ORAL_CAPSULE | Freq: Two times a day (BID) | ORAL | 3 refills | Status: DC
  Filled 2022-10-13: qty 120, 30d supply, fill #0
  Filled 2022-11-07: qty 120, 30d supply, fill #1
  Filled 2022-12-05: qty 120, 30d supply, fill #2
  Filled 2023-01-10: qty 120, 30d supply, fill #3
  Filled 2023-02-01 – 2023-02-07 (×2): qty 120, 30d supply, fill #4
  Filled 2023-03-05: qty 120, 30d supply, fill #5
  Filled 2023-04-05: qty 120, 30d supply, fill #6

## 2022-10-13 MED ORDER — METHYLPHENIDATE HCL ER (CD) 20 MG PO CPCR
20.0000 mg | ORAL_CAPSULE | Freq: Every day | ORAL | 0 refills | Status: AC
  Filled 2022-10-13: qty 30, 30d supply, fill #0

## 2022-10-13 NOTE — Addendum Note (Signed)
Addended by: Fidel Levy on: 10/13/2022 09:06 AM   Modules accepted: Orders

## 2022-10-13 NOTE — Telephone Encounter (Signed)
**Note De-Identified Alexis Black Obfuscation** Vascepa PA started through covermymeds. Key: [id number]

## 2022-10-13 NOTE — Patient Instructions (Addendum)
Medication Instructions:  START vascepa -- 2 capsules twice daily  Co-pay card: [email or web address]  *If you need a refill on your cardiac medications before your next appointment, please call your pharmacy*   Lab Work: FASTING lab work to check cholesterol in about 3-4 months -- complete about 1 week before next appointment  If you have labs (blood work) drawn today and your tests are completely normal, you will receive your results only by: [place] (if you have MyChart) OR A paper copy in the mail If you have any lab test that is abnormal or we need to change your treatment, we will call you to review the results.   Follow-Up: At [facility], you and your health needs are our priority.  As part of our continuing mission to provide you with exceptional heart care, we have created designated Provider Care Teams.  These Care Teams include your primary Cardiologist (physician) and Advanced Practice Providers (APPs -  Physician Assistants and Nurse Practitioners) who all work together to provide you with the care you need, when you need it.  We recommend signing up for the patient portal called "MyChart".  Sign up information is provided on this After Visit Summary.  MyChart is used to connect with patients for Virtual Visits (Telemedicine).  Patients are able to view lab/test results, encounter notes, upcoming appointments, etc.  Non-urgent messages can be sent to your provider as well.   To learn more about what you can do with MyChart, go to [email or web address].    Your next appointment:    3-4 months with Dr. Debara Pickett -- in person or video visit

## 2022-10-13 NOTE — Progress Notes (Signed)
Virtual Visit via Video Note   Because of Alexis Black's co-morbid illnesses, Alexis Black is at least at moderate risk for complications without adequate follow up.  This format is felt to be most appropriate for this patient at this time.  All issues noted in this document were discussed and addressed.  A limited physical exam was performed with this format.  Please refer to the patient's chart for her consent to telehealth for [facility].      Date:  10/13/2022   ID:  Alexis, Black 02/06/1961, MRN [id number] The patient was identified using 2 identifiers.  Evaluation Performed:  New Patient Evaluation  Patient Location:  [place] [address]  Provider location:   [address], [place] [address] [place], [place] [address]  PCP:  Donnajean Lopes, MD  Cardiologist:  Quay Burow, MD Electrophysiologist:  None   Chief Complaint:  High triglycerides  History of Present Illness:    Alexis Black is a 62 y.o. female who presents via audio/video conferencing for a telehealth visit today.  This is a pleasant 62 year old female kindly referred for evaluation management of dyslipidemia.  Alexis Black has a family history of high cholesterol primarily in her father who had early onset heart disease and high triglycerides.  Alexis Black is also struggled with high triglycerides and high cholesterol.  Unfortunately Alexis Black cannot tolerate statins and has failed several before because of severe cramping.  Alexis Black was then successfully switched to Praluent which has worked well to lower her LDL cholesterol.  Primarily her recent lipids have shown a total cholesterol of 188, triglycerides 457, HDL 35 and LDL of 80.  Despite this her triglycerides remain significantly elevated and this is a secondary risk factor for cardiovascular disease.  Alexis Black was noted to have coronary artery disease by CT coronary angiography in May 2023.  Alexis Black was also having chest pain at that time.  Some score was 196, 96  percentile for age and sex matched controls.  There was moderate disease in the RCA.  Cardiac catheterization was recommended and performed which showed fortunately only minimal nonobstructive coronary disease.  Prior CV studies:   The following studies were reviewed today:  Chart reviewed, labwork  PMHx:  Past Medical History:  Diagnosis Date   Blood transfusion without reported diagnosis 1998   Chronic kidney disease    no stage- on preventative meds   Deafness in left ear    DM Type 2    on meds   Family history of adverse reaction to anesthesia    mother - PONV   GERD (gastroesophageal reflux disease)    on meds   Heart palpitations    "with caffeine" occ has   History of bronchitis    last time 02-2021   History of COVID-19 02/2021   bronchitis symptoms x 4 to 5 days all symptoms resolved   History of hiatal hernia    Numbness and tingling of leg    burning both feet @ times   PONV (postoperative nausea and vomiting)    S/P insertion of spinal cord stimulator    Seasonal allergies    Shortness of breath dyspnea    "with caffiene"    Sleep apnea    uses CPAP   Wears glasses    Wears hearing aid in left ear 09/16/2021   sometimes wears  per pt    Past Surgical History:  Procedure Laterality Date   ABDOMINAL HYSTERECTOMY     with  right ovary removed over 30 yrs ago per pt on 09-16-2021   [place], 2000 lower   CARPAL TUNNEL RELEASE Right 09/21/2021   Procedure: Right Carpal Tunnel Release + Right Dequervains first dorsal compartment release;  Surgeon: [name], MD;  Location: [facility];  Service: Orthopedics;  Laterality: Right;  with local anesthesia   [place]  2018   MS-MAC-prep good-int hems-TA x 1=5 yr recall   ESOPHAGOGASTRODUODENOSCOPY  10/2020   INNER EAR SURGERY Left 1983   LEFT HEART CATH AND CORONARY ANGIOGRAPHY N/A 05/17/2022   Procedure: LEFT HEART CATH AND  CORONARY ANGIOGRAPHY;  Surgeon: Lorretta Harp, MD;  Location: [facility];  Service: Cardiovascular;  Laterality: N/A;   left salpingo-oophorectomy     yrs ago after hysterectomy per pt on 09-28-2021   PLANTAR FASCIA SURGERY Bilateral    over 15 yrs ago per pt on 09-17-2021   ROTATOR CUFF REPAIR Bilateral    over 10 yrs ago   [place] N/A 02/15/2022   Procedure: SPINAL CORD STIMULATOR BATTERY EXCHANGE;  Surgeon: Melina Schools, MD;  Location: [place];  Service: Orthopedics;  Laterality: N/A;   SPINAL CORD STIMULATOR INSERTION N/A 05/13/2016   Procedure: LUMBAR SPINAL CORD STIMULATOR INSERTION;  Surgeon: Melina Schools, MD;  Location: [place];  Service: Orthopedics;  Laterality: N/A;   UPPER GASTROINTESTINAL ENDOSCOPY  2018    FAMHx:  Family History  Problem Relation Age of Onset   Diabetes Mother        Carcinomoid tumors all over   Heart disease Mother    Diabetes Father    Colon cancer Neg Hx    Colon polyps Neg Hx    Esophageal cancer Neg Hx    Rectal cancer Neg Hx    Stomach cancer Neg Hx     SOCHx:   reports that Alexis Black has never smoked. Alexis Black has never used smokeless tobacco. Alexis Black reports that Alexis Black does not drink alcohol and does not use drugs.  ALLERGIES:  Allergies  Allergen Reactions   Actos [Pioglitazone] Other (See Comments)    bloated   Crestor [Rosuvastatin]     myalgias   Pravachol [Pravastatin]     Other reaction(s): had muscle cramps   Quinapril Hcl     Other reaction(s): Unknown   Trulicity [Dulaglutide] Other (See Comments)    Other reaction(s): nausea with trulicity    MEDS:  Current Meds  Medication Sig   Alirocumab 75 MG/ML SOAJ Inject 1 mL into the skin every 14 (fourteen) days.   amLODipine (NORVASC) 5 MG tablet Take 5 mg by mouth daily.   Coenzyme Q10 100 MG capsule Take 100 mg by mouth daily.   Continuous Blood Gluc Sensor (FREESTYLE LIBRE 2 SENSOR) MISC by Does not apply route.   dapagliflozin propanediol  (FARXIGA) 5 MG TABS tablet Take 5 mg by mouth daily.   escitalopram (LEXAPRO) 5 MG tablet Take 5 mg by mouth daily.   estradiol (ESTRACE) 0.1 MG/GM vaginal cream Place 1 Applicatorful vaginally 2 (two) times a week.   gabapentin (NEURONTIN) 300 MG capsule Take 300 mg by mouth at bedtime.   lidocaine (LIDODERM) 5 % Place 1 patch onto the skin daily.   metFORMIN (GLUCOPHAGE) 500 MG tablet Take 1,000 mg by mouth 2 (two) times daily.   methylphenidate (METADATE CD) 20 MG CR capsule Take 20 mg by mouth every morning.  methylphenidate (METADATE CD) 20 MG CR capsule Take 1 capsule (20 mg total) by mouth in the morning before breakfast.   metoprolol succinate (TOPROL-XL) 50 MG 24 hr tablet Take 50 mg by mouth at bedtime.   Multiple Vitamins-Minerals (MULTIVITAMIN WITH MINERALS) tablet Take 1 tablet by mouth daily.   progesterone (PROMETRIUM) 100 MG capsule Take 100 mg by mouth at bedtime.   RABEprazole (ACIPHEX) 20 MG tablet Take 20 mg by mouth daily.   telmisartan-hydrochlorothiazide (MICARDIS HCT) 80-12.5 MG tablet Take 1 tablet by mouth daily.   tirzepatide (MOUNJARO) 12.5 MG/0.5ML Pen Inject 12.5 mg into the skin once a week.   Current Facility-Administered Medications for the 10/13/22 encounter (Video Visit) with Pixie Casino, MD  Medication   sodium chloride flush (NS) 0.9 % injection 3 mL     ROS: Pertinent items noted in HPI and remainder of comprehensive ROS otherwise negative.  Labs/Other Tests and Data Reviewed:    Recent Labs: 05/14/2022: BUN 21; Creatinine, Ser 1.29; Hemoglobin 11.5; Platelets 292; Potassium 4.2; Sodium 141   Recent Lipid Panel Lab Results  Component Value Date/Time   CHOL 188 05/14/2022 09:14 AM   TRIG 457 (H) 05/14/2022 09:14 AM   HDL 35 (L) 05/14/2022 09:14 AM   CHOLHDL 5.4 (H) 05/14/2022 09:14 AM   CHOLHDL 5.8 07/02/2011 01:35 AM   LDLCALC 80 05/14/2022 09:14 AM    Wt Readings from Last 3 Encounters:  10/13/22 175 lb (79.4 kg)  08/10/22 169 lb (76.7  kg)  07/26/22 169 lb (76.7 kg)     Exam:    Vital Signs:  Ht '5\' 8"'$  (1.727 m)   Wt 175 lb (79.4 kg)   BMI 26.61 kg/m    General appearance: alert and no distress Lungs: no wheezing Abdomen: normal weight Extremities: extremities normal, atraumatic, no cyanosis or edema Neurologic: Grossly normal  ASSESSMENT & PLAN:    Mixed dyslipidemia, 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 Footman has a mixed dyslipidemia and remains somewhat above target with LDL but also has persistent high triglycerides.  Alexis Black is on Praluent which has minimal benefits on triglycerides but cannot tolerate statins.  Based on the 2019 REDUCE-IT trial, Alexis Black is a good candidate for the addition of Vascepa.  Recommend starting 2 g twice daily.  Will plan repeat lipids in about 3 to 4 months.  Follow-up with me at that time.  I did discuss the core clinical trial with the specific APO C3 inhibitor for triglycerides, however Alexis Black was not interested in participating in a clinical trial.  Thanks as always for the kind referral.  Patient Risk:   After full review of this patients clinical status, I feel that they are at least moderate risk at this time.  Time:   Today, I have spent 25 minutes with the patient with telehealth technology discussing dyslipidemia.     Medication Adjustments/Labs and Tests Ordered: Current medicines are reviewed at length with the patient today.  Concerns regarding medicines are outlined above.   Tests Ordered: No orders of the defined types were placed in this encounter.   Medication Changes: No orders of the defined types were placed in this encounter.   Disposition:  in 6 month(s)  Pixie Casino, MD, [facility] [place] Director of the Advanced Lipid Disorders &  Cardiovascular Risk Reduction Clinic Diplomate of the  American  Board of Clinical Lipidology Attending Cardiologist  Direct Dial: [phone]  Fax: [phone]  Website:  [email or web address]  Pixie Casino, MD  10/13/2022 8:37 AM

## 2022-10-13 NOTE — Telephone Encounter (Addendum)
**Note De-identified Zerick Prevette Obfuscation** -----  **Note De-Identified Shali Vesey Obfuscation** Message from Fidel Levy, RN sent at 10/13/2022  9:06 AM EST ----- This patient will need a PA for vascepa - 2 capsules PO BID  Thanks!!

## 2022-10-15 ENCOUNTER — Other Ambulatory Visit (HOSPITAL_COMMUNITY): Payer: Self-pay

## 2022-10-15 NOTE — Telephone Encounter (Signed)
**Note De-Identified Jametta Moorehead Obfuscation** Per letter received from [employer] the pts plan prefers Icosapent over name brand Vascepa:  [facility] [place] [employer] [place], [place] [address] Prescriber: Fransico Him Re: Alexis Black DOB: 02-28-1961 Formulary alternative(s) are icosapent ethyl, omega-3-acid ethyl esters.  I called [facility] (Ph: [phone]) and was advised that Icosapent is covered by the pts plan and that they have filled the RX and have notified the pt that it is ready for pick up.

## 2022-11-07 ENCOUNTER — Other Ambulatory Visit (HOSPITAL_COMMUNITY): Payer: Self-pay

## 2022-11-08 ENCOUNTER — Other Ambulatory Visit (HOSPITAL_COMMUNITY): Payer: Self-pay

## 2022-11-08 MED ORDER — METHYLPHENIDATE HCL ER (CD) 20 MG PO CPCR
20.0000 mg | ORAL_CAPSULE | Freq: Every morning | ORAL | 0 refills | Status: DC
  Filled 2022-11-08: qty 30, 30d supply, fill #0

## 2022-11-09 ENCOUNTER — Other Ambulatory Visit (HOSPITAL_COMMUNITY): Payer: Self-pay

## 2022-11-10 ENCOUNTER — Other Ambulatory Visit (HOSPITAL_COMMUNITY): Payer: Self-pay

## 2022-11-11 ENCOUNTER — Other Ambulatory Visit (HOSPITAL_COMMUNITY): Payer: Self-pay

## 2022-11-22 DIAGNOSIS — Z794 Long term (current) use of insulin: Secondary | ICD-10-CM | POA: Diagnosis not present

## 2022-11-22 DIAGNOSIS — R809 Proteinuria, unspecified: Secondary | ICD-10-CM | POA: Diagnosis not present

## 2022-11-22 DIAGNOSIS — E1129 Type 2 diabetes mellitus with other diabetic kidney complication: Secondary | ICD-10-CM | POA: Diagnosis not present

## 2022-12-03 DIAGNOSIS — G4733 Obstructive sleep apnea (adult) (pediatric): Secondary | ICD-10-CM | POA: Diagnosis not present

## 2022-12-05 ENCOUNTER — Other Ambulatory Visit (HOSPITAL_COMMUNITY): Payer: Self-pay

## 2022-12-07 ENCOUNTER — Other Ambulatory Visit (HOSPITAL_COMMUNITY): Payer: Self-pay

## 2022-12-07 ENCOUNTER — Other Ambulatory Visit: Payer: Self-pay

## 2022-12-07 DIAGNOSIS — E1129 Type 2 diabetes mellitus with other diabetic kidney complication: Secondary | ICD-10-CM | POA: Diagnosis not present

## 2022-12-07 DIAGNOSIS — Z794 Long term (current) use of insulin: Secondary | ICD-10-CM | POA: Diagnosis not present

## 2022-12-07 DIAGNOSIS — R809 Proteinuria, unspecified: Secondary | ICD-10-CM | POA: Diagnosis not present

## 2022-12-07 DIAGNOSIS — E785 Hyperlipidemia, unspecified: Secondary | ICD-10-CM | POA: Diagnosis not present

## 2022-12-07 MED ORDER — METHYLPHENIDATE HCL ER (CD) 20 MG PO CPCR
20.0000 mg | ORAL_CAPSULE | Freq: Every morning | ORAL | 0 refills | Status: DC
  Filled 2022-12-07: qty 30, 30d supply, fill #0

## 2022-12-09 ENCOUNTER — Other Ambulatory Visit: Payer: Self-pay

## 2022-12-10 ENCOUNTER — Other Ambulatory Visit (HOSPITAL_COMMUNITY): Payer: Self-pay

## 2022-12-13 ENCOUNTER — Other Ambulatory Visit (HOSPITAL_COMMUNITY): Payer: Self-pay

## 2022-12-14 ENCOUNTER — Other Ambulatory Visit: Payer: Self-pay

## 2022-12-14 ENCOUNTER — Other Ambulatory Visit (HOSPITAL_COMMUNITY): Payer: Self-pay

## 2022-12-14 MED ORDER — OZEMPIC (2 MG/DOSE) 8 MG/3ML ~~LOC~~ SOPN
2.0000 mg | PEN_INJECTOR | SUBCUTANEOUS | 11 refills | Status: DC
  Filled 2022-12-14: qty 3, 28d supply, fill #0

## 2022-12-15 ENCOUNTER — Other Ambulatory Visit (HOSPITAL_COMMUNITY): Payer: Self-pay

## 2023-01-03 ENCOUNTER — Other Ambulatory Visit (HOSPITAL_COMMUNITY): Payer: Self-pay

## 2023-01-05 ENCOUNTER — Other Ambulatory Visit (HOSPITAL_COMMUNITY): Payer: Self-pay

## 2023-01-06 DIAGNOSIS — I700 Atherosclerosis of aorta: Secondary | ICD-10-CM | POA: Diagnosis not present

## 2023-01-06 DIAGNOSIS — I739 Peripheral vascular disease, unspecified: Secondary | ICD-10-CM | POA: Diagnosis not present

## 2023-01-06 DIAGNOSIS — E785 Hyperlipidemia, unspecified: Secondary | ICD-10-CM | POA: Diagnosis not present

## 2023-01-06 DIAGNOSIS — F900 Attention-deficit hyperactivity disorder, predominantly inattentive type: Secondary | ICD-10-CM | POA: Diagnosis not present

## 2023-01-06 DIAGNOSIS — I10 Essential (primary) hypertension: Secondary | ICD-10-CM | POA: Diagnosis not present

## 2023-01-06 DIAGNOSIS — G4733 Obstructive sleep apnea (adult) (pediatric): Secondary | ICD-10-CM | POA: Diagnosis not present

## 2023-01-06 DIAGNOSIS — E1151 Type 2 diabetes mellitus with diabetic peripheral angiopathy without gangrene: Secondary | ICD-10-CM | POA: Diagnosis not present

## 2023-01-06 DIAGNOSIS — R110 Nausea: Secondary | ICD-10-CM | POA: Diagnosis not present

## 2023-01-07 ENCOUNTER — Other Ambulatory Visit (HOSPITAL_COMMUNITY): Payer: Self-pay

## 2023-01-07 MED ORDER — METHYLPHENIDATE HCL ER (CD) 20 MG PO CPCR
20.0000 mg | ORAL_CAPSULE | Freq: Every morning | ORAL | 0 refills | Status: DC
  Filled 2023-01-07: qty 30, 30d supply, fill #0

## 2023-01-10 ENCOUNTER — Other Ambulatory Visit: Payer: Self-pay

## 2023-01-26 DIAGNOSIS — E113293 Type 2 diabetes mellitus with mild nonproliferative diabetic retinopathy without macular edema, bilateral: Secondary | ICD-10-CM | POA: Diagnosis not present

## 2023-01-26 DIAGNOSIS — H35373 Puckering of macula, bilateral: Secondary | ICD-10-CM | POA: Diagnosis not present

## 2023-01-26 DIAGNOSIS — H2513 Age-related nuclear cataract, bilateral: Secondary | ICD-10-CM | POA: Diagnosis not present

## 2023-01-26 DIAGNOSIS — H52223 Regular astigmatism, bilateral: Secondary | ICD-10-CM | POA: Diagnosis not present

## 2023-01-26 DIAGNOSIS — H35341 Macular cyst, hole, or pseudohole, right eye: Secondary | ICD-10-CM | POA: Diagnosis not present

## 2023-01-26 DIAGNOSIS — H5203 Hypermetropia, bilateral: Secondary | ICD-10-CM | POA: Diagnosis not present

## 2023-01-26 DIAGNOSIS — H43813 Vitreous degeneration, bilateral: Secondary | ICD-10-CM | POA: Diagnosis not present

## 2023-01-26 DIAGNOSIS — H524 Presbyopia: Secondary | ICD-10-CM | POA: Diagnosis not present

## 2023-02-03 ENCOUNTER — Other Ambulatory Visit: Payer: Self-pay

## 2023-02-04 ENCOUNTER — Other Ambulatory Visit: Payer: Self-pay

## 2023-02-07 ENCOUNTER — Other Ambulatory Visit (HOSPITAL_COMMUNITY): Payer: Self-pay

## 2023-02-07 ENCOUNTER — Encounter (INDEPENDENT_AMBULATORY_CARE_PROVIDER_SITE_OTHER): Payer: 59 | Admitting: Ophthalmology

## 2023-02-07 DIAGNOSIS — H43813 Vitreous degeneration, bilateral: Secondary | ICD-10-CM | POA: Diagnosis not present

## 2023-02-07 DIAGNOSIS — H35371 Puckering of macula, right eye: Secondary | ICD-10-CM

## 2023-02-21 DIAGNOSIS — Z794 Long term (current) use of insulin: Secondary | ICD-10-CM | POA: Diagnosis not present

## 2023-02-21 DIAGNOSIS — E1129 Type 2 diabetes mellitus with other diabetic kidney complication: Secondary | ICD-10-CM | POA: Diagnosis not present

## 2023-02-21 DIAGNOSIS — E785 Hyperlipidemia, unspecified: Secondary | ICD-10-CM | POA: Diagnosis not present

## 2023-02-21 DIAGNOSIS — R809 Proteinuria, unspecified: Secondary | ICD-10-CM | POA: Diagnosis not present

## 2023-02-24 DIAGNOSIS — L720 Epidermal cyst: Secondary | ICD-10-CM | POA: Diagnosis not present

## 2023-02-24 DIAGNOSIS — L570 Actinic keratosis: Secondary | ICD-10-CM | POA: Diagnosis not present

## 2023-03-03 ENCOUNTER — Other Ambulatory Visit (HOSPITAL_COMMUNITY): Payer: Self-pay

## 2023-03-05 ENCOUNTER — Other Ambulatory Visit (HOSPITAL_COMMUNITY): Payer: Self-pay

## 2023-03-07 ENCOUNTER — Other Ambulatory Visit: Payer: Self-pay

## 2023-03-07 ENCOUNTER — Other Ambulatory Visit (HOSPITAL_COMMUNITY): Payer: Self-pay

## 2023-03-07 MED ORDER — METHYLPHENIDATE HCL ER (CD) 20 MG PO CPCR
20.0000 mg | ORAL_CAPSULE | Freq: Every morning | ORAL | 0 refills | Status: DC
  Filled 2023-03-07: qty 30, 30d supply, fill #0

## 2023-03-11 ENCOUNTER — Other Ambulatory Visit (HOSPITAL_COMMUNITY): Payer: Self-pay

## 2023-04-05 ENCOUNTER — Other Ambulatory Visit: Payer: Self-pay

## 2023-04-05 ENCOUNTER — Other Ambulatory Visit (HOSPITAL_COMMUNITY): Payer: Self-pay

## 2023-04-06 ENCOUNTER — Other Ambulatory Visit: Payer: Self-pay

## 2023-04-06 ENCOUNTER — Other Ambulatory Visit (HOSPITAL_COMMUNITY): Payer: Self-pay

## 2023-04-06 MED ORDER — METHYLPHENIDATE HCL ER (CD) 20 MG PO CPCR
20.0000 mg | ORAL_CAPSULE | Freq: Every morning | ORAL | 0 refills | Status: DC
  Filled 2023-04-13: qty 30, 30d supply, fill #0

## 2023-04-13 ENCOUNTER — Other Ambulatory Visit (HOSPITAL_COMMUNITY): Payer: Self-pay

## 2023-04-28 DIAGNOSIS — M654 Radial styloid tenosynovitis [de Quervain]: Secondary | ICD-10-CM | POA: Diagnosis not present

## 2023-05-04 ENCOUNTER — Other Ambulatory Visit (HOSPITAL_COMMUNITY): Payer: Self-pay

## 2023-06-07 DIAGNOSIS — E1129 Type 2 diabetes mellitus with other diabetic kidney complication: Secondary | ICD-10-CM | POA: Diagnosis not present

## 2023-06-07 DIAGNOSIS — Z794 Long term (current) use of insulin: Secondary | ICD-10-CM | POA: Diagnosis not present

## 2023-06-07 DIAGNOSIS — R809 Proteinuria, unspecified: Secondary | ICD-10-CM | POA: Diagnosis not present

## 2023-06-07 DIAGNOSIS — Z2821 Immunization not carried out because of patient refusal: Secondary | ICD-10-CM | POA: Diagnosis not present

## 2023-06-07 DIAGNOSIS — E785 Hyperlipidemia, unspecified: Secondary | ICD-10-CM | POA: Diagnosis not present

## 2023-06-24 ENCOUNTER — Telehealth: Payer: Self-pay | Admitting: Cardiovascular Disease

## 2023-06-24 NOTE — Telephone Encounter (Signed)
Received call from patient she stated she has been having shocking sensations across chest this morning.Sensations last appox 2 seconds.No shocking sensation at present.She has felt light headed.Appointment scheduled with Bernadene Person NP 10/23 at 2:45 pm.Advised if symptoms worsen she will need to go to ED.

## 2023-06-24 NOTE — Telephone Encounter (Signed)
   Pt c/o of Chest Pain: STAT if active CP, including tightness, pressure, jaw pain, radiating pain to shoulder/upper arm/back, CP unrelieved by Nitro. Symptoms reported of SOB, nausea, vomiting, sweating.  1. Are you having CP right now? Feels like a little jolt or shocking in in her heart    2. Are you experiencing any other symptoms (ex. SOB, nausea, vomiting, sweating)? Lightheaded, felt like she blacked out, but she did not- felt real fludh- all this happen an hour ago   3. Is your CP continuous or coming and going? Comes and  goes   4. Have you taken Nitroglycerin? no   5. How long have you been experiencing CP? Started this morninf    6. If NO CP at time of call then end call with telling Pt to call back or call 911 if Chest pain returns prior to return call from triage team.

## 2023-06-29 ENCOUNTER — Ambulatory Visit: Payer: 59 | Admitting: Nurse Practitioner

## 2023-07-11 DIAGNOSIS — E1151 Type 2 diabetes mellitus with diabetic peripheral angiopathy without gangrene: Secondary | ICD-10-CM | POA: Diagnosis not present

## 2023-07-11 DIAGNOSIS — I10 Essential (primary) hypertension: Secondary | ICD-10-CM | POA: Diagnosis not present

## 2023-07-11 DIAGNOSIS — I700 Atherosclerosis of aorta: Secondary | ICD-10-CM | POA: Diagnosis not present

## 2023-07-11 DIAGNOSIS — G4733 Obstructive sleep apnea (adult) (pediatric): Secondary | ICD-10-CM | POA: Diagnosis not present

## 2023-07-11 DIAGNOSIS — I739 Peripheral vascular disease, unspecified: Secondary | ICD-10-CM | POA: Diagnosis not present

## 2023-07-11 DIAGNOSIS — H60331 Swimmer's ear, right ear: Secondary | ICD-10-CM | POA: Diagnosis not present

## 2023-07-18 ENCOUNTER — Telehealth: Payer: Self-pay | Admitting: Pharmacy Technician

## 2023-07-18 NOTE — Telephone Encounter (Signed)
Pharmacy Patient Advocate Encounter   Received notification from Fax that prior authorization for praluent is required/requested.   Insurance verification completed.   The patient is insured through [facility] .   Per test claim: PA required; PA submitted to above mentioned insurance via Fax Key/confirmation #/EOC faxed  Status is pending

## 2023-07-20 NOTE — Telephone Encounter (Signed)
Pharmacy Patient Advocate Encounter  Received notification from [facility] that Prior Authorization for praluent has been APPROVED from 07/19/23 to 07/18/24   PA #/Case ID/Reference #: [id number]

## 2023-08-02 DIAGNOSIS — Z01419 Encounter for gynecological examination (general) (routine) without abnormal findings: Secondary | ICD-10-CM | POA: Diagnosis not present

## 2023-08-02 DIAGNOSIS — Z6827 Body mass index (BMI) 27.0-27.9, adult: Secondary | ICD-10-CM | POA: Diagnosis not present

## 2023-08-02 DIAGNOSIS — Z1231 Encounter for screening mammogram for malignant neoplasm of breast: Secondary | ICD-10-CM | POA: Diagnosis not present

## 2023-09-01 ENCOUNTER — Other Ambulatory Visit (HOSPITAL_COMMUNITY): Payer: Self-pay

## 2023-09-01 MED ORDER — METHYLPHENIDATE HCL ER (CD) 20 MG PO CPCR
20.0000 mg | ORAL_CAPSULE | Freq: Every morning | ORAL | 0 refills | Status: DC
  Filled 2023-09-01: qty 30, 30d supply, fill #0

## 2023-09-09 DIAGNOSIS — H35341 Macular cyst, hole, or pseudohole, right eye: Secondary | ICD-10-CM | POA: Diagnosis not present

## 2023-09-09 DIAGNOSIS — H2513 Age-related nuclear cataract, bilateral: Secondary | ICD-10-CM | POA: Diagnosis not present

## 2023-09-09 DIAGNOSIS — E113293 Type 2 diabetes mellitus with mild nonproliferative diabetic retinopathy without macular edema, bilateral: Secondary | ICD-10-CM | POA: Diagnosis not present

## 2023-09-09 DIAGNOSIS — H35373 Puckering of macula, bilateral: Secondary | ICD-10-CM | POA: Diagnosis not present

## 2023-09-09 DIAGNOSIS — H43813 Vitreous degeneration, bilateral: Secondary | ICD-10-CM | POA: Diagnosis not present

## 2023-09-28 ENCOUNTER — Other Ambulatory Visit (HOSPITAL_COMMUNITY): Payer: Self-pay

## 2023-09-28 MED ORDER — METHYLPHENIDATE HCL ER (CD) 20 MG PO CPCR
20.0000 mg | ORAL_CAPSULE | Freq: Every morning | ORAL | 0 refills | Status: AC
  Filled 2023-09-28 – 2023-09-30 (×2): qty 30, 30d supply, fill #0

## 2023-09-28 MED ORDER — METHYLPHENIDATE HCL ER (CD) 20 MG PO CPCR
20.0000 mg | ORAL_CAPSULE | Freq: Every morning | ORAL | 0 refills | Status: AC
  Filled 2023-10-31: qty 30, 30d supply, fill #0

## 2023-09-29 ENCOUNTER — Other Ambulatory Visit (HOSPITAL_COMMUNITY): Payer: Self-pay

## 2023-09-30 ENCOUNTER — Telehealth: Payer: Self-pay | Admitting: Pharmacy Technician

## 2023-09-30 ENCOUNTER — Other Ambulatory Visit (HOSPITAL_COMMUNITY): Payer: Self-pay

## 2023-09-30 ENCOUNTER — Encounter: Payer: Self-pay | Admitting: Pharmacy Technician

## 2023-09-30 ENCOUNTER — Telehealth: Payer: Self-pay | Admitting: Cardiovascular Disease

## 2023-09-30 NOTE — Telephone Encounter (Signed)
Pt c/o medication issue:  1. Name of Medication:   Alirocumab 75 MG/ML SOAJ   2. How are you currently taking this medication (dosage and times per day)?   As prescribed  3. Are you having a reaction (difficulty breathing--STAT)?   No  4. What is your medication issue?   Patient stated she cannot afford this medication and wants to get assistance getting this medication.  Patient noted she is completely out of this medication and wanted to get samples.  Patient stated can also reach her at [phone] after 4:00 pm.

## 2023-09-30 NOTE — Telephone Encounter (Signed)
Pt has a $3,000.00 deductible and it is going to cost her $600 a month until the decuctible is met to get Praluent.   She states that she needs assistance and samples.   Instructed her to contact her insurance company to ask if there is a medication "like" praluent that would be less expensive on her plan?  Informed her that I would also send this to our pharmacy department for recommendations.

## 2023-09-30 NOTE — Telephone Encounter (Signed)
I called the patient and she gave me authorization to fill out her the copay assistance for her through the praluent website. I emailed the patient the copay assistance card through the website and provided the pharmacy with the information. They reprocessed the prescription and the patient now has a 50.00 copay. She said she is happy with the 50.00 copay.

## 2023-10-10 DIAGNOSIS — R809 Proteinuria, unspecified: Secondary | ICD-10-CM | POA: Diagnosis not present

## 2023-10-10 DIAGNOSIS — Z794 Long term (current) use of insulin: Secondary | ICD-10-CM | POA: Diagnosis not present

## 2023-10-10 DIAGNOSIS — E1129 Type 2 diabetes mellitus with other diabetic kidney complication: Secondary | ICD-10-CM | POA: Diagnosis not present

## 2023-10-31 ENCOUNTER — Other Ambulatory Visit (HOSPITAL_COMMUNITY): Payer: Self-pay

## 2023-10-31 MED ORDER — METHYLPHENIDATE HCL ER (CD) 20 MG PO CPCR: 20.0000 mg | ORAL_CAPSULE | Freq: Every morning | ORAL | 0 refills | Status: AC

## 2024-01-09 DIAGNOSIS — Z1331 Encounter for screening for depression: Secondary | ICD-10-CM | POA: Diagnosis not present

## 2024-01-09 DIAGNOSIS — E1151 Type 2 diabetes mellitus with diabetic peripheral angiopathy without gangrene: Secondary | ICD-10-CM | POA: Diagnosis not present

## 2024-01-09 DIAGNOSIS — I10 Essential (primary) hypertension: Secondary | ICD-10-CM | POA: Diagnosis not present

## 2024-01-09 DIAGNOSIS — Z0000 Encounter for general adult medical examination without abnormal findings: Secondary | ICD-10-CM | POA: Diagnosis not present

## 2024-01-09 DIAGNOSIS — I739 Peripheral vascular disease, unspecified: Secondary | ICD-10-CM | POA: Diagnosis not present

## 2024-01-09 DIAGNOSIS — Z1339 Encounter for screening examination for other mental health and behavioral disorders: Secondary | ICD-10-CM | POA: Diagnosis not present

## 2024-01-09 DIAGNOSIS — R300 Dysuria: Secondary | ICD-10-CM | POA: Diagnosis not present

## 2024-01-09 DIAGNOSIS — F900 Attention-deficit hyperactivity disorder, predominantly inattentive type: Secondary | ICD-10-CM | POA: Diagnosis not present

## 2024-01-09 DIAGNOSIS — G4733 Obstructive sleep apnea (adult) (pediatric): Secondary | ICD-10-CM | POA: Diagnosis not present

## 2024-01-19 DIAGNOSIS — M2242 Chondromalacia patellae, left knee: Secondary | ICD-10-CM | POA: Diagnosis not present

## 2024-02-13 ENCOUNTER — Encounter (INDEPENDENT_AMBULATORY_CARE_PROVIDER_SITE_OTHER): Payer: 59 | Admitting: Ophthalmology

## 2024-02-13 DIAGNOSIS — H43813 Vitreous degeneration, bilateral: Secondary | ICD-10-CM | POA: Diagnosis not present

## 2024-02-13 DIAGNOSIS — H35373 Puckering of macula, bilateral: Secondary | ICD-10-CM | POA: Diagnosis not present

## 2024-02-13 DIAGNOSIS — H35342 Macular cyst, hole, or pseudohole, left eye: Secondary | ICD-10-CM | POA: Diagnosis not present

## 2024-04-09 DIAGNOSIS — Z794 Long term (current) use of insulin: Secondary | ICD-10-CM | POA: Diagnosis not present

## 2024-04-09 DIAGNOSIS — E1129 Type 2 diabetes mellitus with other diabetic kidney complication: Secondary | ICD-10-CM | POA: Diagnosis not present

## 2024-04-09 DIAGNOSIS — R809 Proteinuria, unspecified: Secondary | ICD-10-CM | POA: Diagnosis not present

## 2024-04-09 DIAGNOSIS — L910 Hypertrophic scar: Secondary | ICD-10-CM | POA: Diagnosis not present

## 2024-05-09 ENCOUNTER — Telehealth: Payer: Self-pay | Admitting: Internal Medicine

## 2024-05-09 ENCOUNTER — Telehealth: Payer: Self-pay | Admitting: Pharmacy Technician

## 2024-05-09 ENCOUNTER — Other Ambulatory Visit (HOSPITAL_COMMUNITY): Payer: Self-pay

## 2024-05-09 NOTE — Telephone Encounter (Signed)
 Per pt calls     Faxed legacy form to [employer]

## 2024-05-09 NOTE — Telephone Encounter (Signed)
 Pt c/o medication issue:  1. Name of Medication:   Alirocumab  75 MG/ML SOAJ    2. How are you currently taking this medication (dosage and times per day)? As written   3. Are you having a reaction (difficulty breathing--STAT)? No   4. What is your medication issue? Pt called in stating she needs prior auth for this medication.

## 2024-05-09 NOTE — Telephone Encounter (Signed)
 PA request has been Submitted. New Encounter has been or will be created for follow up. For additional info see Pharmacy Prior Auth telephone encounter from 05/09/24.

## 2024-05-10 ENCOUNTER — Other Ambulatory Visit: Payer: Self-pay | Admitting: Cardiovascular Disease

## 2024-05-10 ENCOUNTER — Other Ambulatory Visit (HOSPITAL_COMMUNITY): Payer: Self-pay

## 2024-05-10 DIAGNOSIS — L910 Hypertrophic scar: Secondary | ICD-10-CM | POA: Diagnosis not present

## 2024-05-10 DIAGNOSIS — E782 Mixed hyperlipidemia: Secondary | ICD-10-CM

## 2024-05-10 DIAGNOSIS — I2510 Atherosclerotic heart disease of native coronary artery without angina pectoris: Secondary | ICD-10-CM

## 2024-05-10 NOTE — Telephone Encounter (Signed)
 See correspondence from [facility] scanned in media

## 2024-05-10 NOTE — Telephone Encounter (Signed)
 Needs apt -overdue. Message sent to schedulers

## 2024-05-15 MED ORDER — ALIROCUMAB 75 MG/ML ~~LOC~~ SOAJ: 1.0000 mL | SUBCUTANEOUS | 0 refills | Status: AC

## 2024-05-18 ENCOUNTER — Telehealth: Payer: Self-pay | Admitting: Cardiovascular Disease

## 2024-05-18 NOTE — Telephone Encounter (Signed)
 Rx previously sent on 9/9

## 2024-05-18 NOTE — Telephone Encounter (Signed)
*  STAT* If patient is at the pharmacy, call can be transferred to refill team.   1. Which medications need to be refilled? (please list name of each medication and dose if known) Alirocumab  75 MG/ML SOAJ    2. Would you like to learn more about the convenience, safety, & potential cost savings by using the [facility]? NO    3. Are you open to using the [facility] NO   4. Which pharmacy/location (including street and city if local pharmacy) is medication to be sent to? [facility] [address] [place], [place] [address]   5. Do they need a 30 day or 90 day supply? 90   Please send medication to [facility] not [facility]

## 2024-05-29 ENCOUNTER — Ambulatory Visit: Admitting: Cardiovascular Disease

## 2024-06-04 ENCOUNTER — Other Ambulatory Visit: Payer: Self-pay | Admitting: Otolaryngology

## 2024-06-04 ENCOUNTER — Ambulatory Visit: Admitting: Cardiovascular Disease

## 2024-06-04 DIAGNOSIS — H6123 Impacted cerumen, bilateral: Secondary | ICD-10-CM | POA: Diagnosis not present

## 2024-06-04 DIAGNOSIS — E041 Nontoxic single thyroid nodule: Secondary | ICD-10-CM | POA: Diagnosis not present

## 2024-06-04 DIAGNOSIS — H903 Sensorineural hearing loss, bilateral: Secondary | ICD-10-CM | POA: Diagnosis not present

## 2024-06-04 DIAGNOSIS — J0100 Acute maxillary sinusitis, unspecified: Secondary | ICD-10-CM | POA: Diagnosis not present

## 2024-06-04 DIAGNOSIS — R053 Chronic cough: Secondary | ICD-10-CM | POA: Diagnosis not present

## 2024-06-06 ENCOUNTER — Inpatient Hospital Stay: Admission: RE | Admit: 2024-06-06

## 2024-06-07 ENCOUNTER — Other Ambulatory Visit

## 2024-06-26 ENCOUNTER — Ambulatory Visit
Admission: RE | Admit: 2024-06-26 | Discharge: 2024-06-26 | Disposition: A | Discharge status: Home / Self Care | Source: Ambulatory Visit | Attending: Otolaryngology | Admitting: Otolaryngology

## 2024-06-26 DIAGNOSIS — E042 Nontoxic multinodular goiter: Secondary | ICD-10-CM | POA: Diagnosis not present

## 2024-06-26 DIAGNOSIS — E041 Nontoxic single thyroid nodule: Secondary | ICD-10-CM

## 2024-06-27 DIAGNOSIS — G501 Atypical facial pain: Secondary | ICD-10-CM | POA: Diagnosis not present

## 2024-06-27 DIAGNOSIS — E041 Nontoxic single thyroid nodule: Secondary | ICD-10-CM | POA: Diagnosis not present

## 2024-06-27 DIAGNOSIS — J301 Allergic rhinitis due to pollen: Secondary | ICD-10-CM | POA: Diagnosis not present

## 2024-07-09 NOTE — Progress Notes (Signed)
 Hughes Simmonds, MD sent to Xayasine, Melissa; Carlie Clarita RAMAN Cc: Milissa Hamming, MD; Herminio Miu, MD PROCEDURE / BIOPSY REVIEW Date: 07/03/24  Requested Biopsy site: Thyroid  Reason for request: Nodules Imaging review: Best seen on US   Decision: Approved Imaging modality to perform: Ultrasound Schedule with: No sedation / Local anesthetic Schedule for: APP  Additional comments: @Schedulers . US  Bx of nodules #1 and #3. Local only. No sedation for thyroid  nodule Bx. OK to perform at [facility], [facility] or [facility] Ordering provider to prescribe PO anxiolytic for Pt's anxiety, similar to pre MRI etc  Please contact me with questions, concerns, or if issue pertaining to this request arise.  Simmonds Hughes, MD [facility]

## 2024-07-11 ENCOUNTER — Other Ambulatory Visit (HOSPITAL_COMMUNITY): Payer: Self-pay | Admitting: Otolaryngology

## 2024-07-11 DIAGNOSIS — E041 Nontoxic single thyroid nodule: Secondary | ICD-10-CM

## 2024-07-16 DIAGNOSIS — G4733 Obstructive sleep apnea (adult) (pediatric): Secondary | ICD-10-CM | POA: Diagnosis not present

## 2024-07-16 DIAGNOSIS — E1151 Type 2 diabetes mellitus with diabetic peripheral angiopathy without gangrene: Secondary | ICD-10-CM | POA: Diagnosis not present

## 2024-07-16 DIAGNOSIS — N1831 Chronic kidney disease, stage 3a: Secondary | ICD-10-CM | POA: Diagnosis not present

## 2024-07-16 DIAGNOSIS — F900 Attention-deficit hyperactivity disorder, predominantly inattentive type: Secondary | ICD-10-CM | POA: Diagnosis not present

## 2024-07-16 DIAGNOSIS — R5382 Chronic fatigue, unspecified: Secondary | ICD-10-CM | POA: Diagnosis not present

## 2024-07-16 DIAGNOSIS — F419 Anxiety disorder, unspecified: Secondary | ICD-10-CM | POA: Diagnosis not present

## 2024-07-16 DIAGNOSIS — I129 Hypertensive chronic kidney disease with stage 1 through stage 4 chronic kidney disease, or unspecified chronic kidney disease: Secondary | ICD-10-CM | POA: Diagnosis not present

## 2024-07-16 DIAGNOSIS — R82998 Other abnormal findings in urine: Secondary | ICD-10-CM | POA: Diagnosis not present

## 2024-07-16 DIAGNOSIS — I739 Peripheral vascular disease, unspecified: Secondary | ICD-10-CM | POA: Diagnosis not present

## 2024-07-23 ENCOUNTER — Telehealth: Payer: Self-pay | Admitting: Cardiovascular Disease

## 2024-07-23 NOTE — Telephone Encounter (Signed)
 Pt c/o medication issue:  1. Name of Medication:   Alirocumab  75 MG/ML SOAJ   2. How are you currently taking this medication (dosage and times per day)?   3. Are you having a reaction (difficulty breathing--STAT)?   4. What is your medication issue?   Patient stated her insurance company wants a prior authorization for this medication.  Patient wants refill sent to [facility] [place] [facility] [place], [place] - [address]

## 2024-07-24 ENCOUNTER — Other Ambulatory Visit (HOSPITAL_COMMUNITY): Payer: Self-pay

## 2024-07-24 ENCOUNTER — Telehealth: Payer: Self-pay | Admitting: Pharmacy Technician

## 2024-07-24 DIAGNOSIS — E782 Mixed hyperlipidemia: Secondary | ICD-10-CM

## 2024-07-24 NOTE — Telephone Encounter (Addendum)
 Pharmacy Patient Advocate Encounter   Received notification from Physician's Office- jasmin w that prior authorization for praluent  is required/requested.   Insurance verification completed.   The patient is insured through [employer].   Per test claim: PA required; PA submitted to above mentioned insurance via Latent Key/confirmation #/EOC [id number] Status is pending

## 2024-07-26 ENCOUNTER — Other Ambulatory Visit (HOSPITAL_COMMUNITY): Payer: Self-pay

## 2024-07-30 ENCOUNTER — Other Ambulatory Visit (HOSPITAL_COMMUNITY): Payer: Self-pay | Admitting: Otolaryngology

## 2024-07-30 ENCOUNTER — Other Ambulatory Visit (HOSPITAL_COMMUNITY): Payer: Self-pay

## 2024-07-30 DIAGNOSIS — E041 Nontoxic single thyroid nodule: Secondary | ICD-10-CM

## 2024-08-01 ENCOUNTER — Other Ambulatory Visit (HOSPITAL_COMMUNITY): Payer: Self-pay

## 2024-08-03 ENCOUNTER — Ambulatory Visit (HOSPITAL_COMMUNITY)
Admission: RE | Admit: 2024-08-03 | Discharge: 2024-08-03 | Disposition: A | Discharge status: Home / Self Care | Source: Ambulatory Visit | Attending: Otolaryngology | Admitting: Otolaryngology

## 2024-08-03 ENCOUNTER — Encounter (HOSPITAL_COMMUNITY): Payer: Self-pay

## 2024-08-03 DIAGNOSIS — E041 Nontoxic single thyroid nodule: Secondary | ICD-10-CM | POA: Insufficient documentation

## 2024-08-03 DIAGNOSIS — E042 Nontoxic multinodular goiter: Secondary | ICD-10-CM | POA: Diagnosis not present

## 2024-08-03 MED ORDER — LIDOCAINE HCL 1 % IJ SOLN
INTRAMUSCULAR | Status: AC
  Filled 2024-08-03: qty 20

## 2024-08-03 NOTE — Procedures (Signed)
  Procedure:  US  guided FNA biopsy L superior and R superior thyroid  nodules x5 ea Preprocedure diagnosis: The encounter diagnosis was Thyroid  nodule. Postprocedure diagnosis: same EBL:    minimal Complications:   none immediate  See full dictation in [employer].  CHARM Toribio Faes MD Main # [phone] Pager  [phone] Mobile [phone]

## 2024-08-06 ENCOUNTER — Other Ambulatory Visit (HOSPITAL_COMMUNITY): Payer: Self-pay

## 2024-08-07 ENCOUNTER — Other Ambulatory Visit (HOSPITAL_COMMUNITY): Payer: Self-pay

## 2024-08-07 LAB — CYTOLOGY - NON PAP

## 2024-08-08 ENCOUNTER — Other Ambulatory Visit: Payer: Self-pay

## 2024-08-08 ENCOUNTER — Other Ambulatory Visit (HOSPITAL_COMMUNITY): Payer: Self-pay

## 2024-08-08 DIAGNOSIS — N951 Menopausal and female climacteric states: Secondary | ICD-10-CM | POA: Diagnosis not present

## 2024-08-08 DIAGNOSIS — Z1231 Encounter for screening mammogram for malignant neoplasm of breast: Secondary | ICD-10-CM | POA: Diagnosis not present

## 2024-08-08 DIAGNOSIS — N959 Unspecified menopausal and perimenopausal disorder: Secondary | ICD-10-CM | POA: Diagnosis not present

## 2024-08-08 DIAGNOSIS — E782 Mixed hyperlipidemia: Secondary | ICD-10-CM

## 2024-08-08 DIAGNOSIS — Z6825 Body mass index (BMI) 25.0-25.9, adult: Secondary | ICD-10-CM | POA: Diagnosis not present

## 2024-08-08 DIAGNOSIS — Z01419 Encounter for gynecological examination (general) (routine) without abnormal findings: Secondary | ICD-10-CM | POA: Diagnosis not present

## 2024-08-08 NOTE — Telephone Encounter (Signed)
 Orders for Lipids placed.

## 2024-08-08 NOTE — Progress Notes (Signed)
 Orders for lipids placed per OK from Dr. Court

## 2024-08-08 NOTE — Telephone Encounter (Signed)
 Hi, [employer] just said this was denied due to not having labs completed in the last 6 months. Can the patient please get updated lipid labs? then we can fax the labs to the fax number below for reconsideration of this prior authorization. Thank you   [phone]  Fax [phone]

## 2024-08-13 NOTE — Telephone Encounter (Signed)
 Still waiting on labs.

## 2024-08-20 NOTE — Telephone Encounter (Signed)
 Will resubmit once labs are updated

## 2024-08-28 NOTE — Telephone Encounter (Signed)
 Spoke with pt regarding needing updated fasting lipid panel. Pt checks to see if she's had one in the last 6 months but can not find one with her PCP. Pt has now missed 1 dose of PCSK9i. Pt states that her PCP's office informed her that her current insurance now prefers Repatha, however they were unable to get PA approved. Pt does state that her insurance will change on 09/06/24. When pt receives new insurance card she will provide to us  or bring to her appointment with Dr. Court on 09/17/24 at 2:30pm. Pt will also work on getting lipid panel done between now and her office visit. Pt verbalizes understanding.

## 2024-09-11 LAB — LIPID PANEL
Chol/HDL Ratio: 5.4 ratio — ABNORMAL HIGH (ref 0.0–4.4)
Cholesterol, Total: 185 mg/dL (ref 100–199)
HDL: 34 mg/dL — ABNORMAL LOW
LDL Chol Calc (NIH): 109 mg/dL — ABNORMAL HIGH (ref 0–99)
Triglycerides: 245 mg/dL — ABNORMAL HIGH (ref 0–149)
VLDL Cholesterol Cal: 42 mg/dL — ABNORMAL HIGH (ref 5–40)

## 2024-09-12 ENCOUNTER — Ambulatory Visit: Payer: Self-pay | Admitting: Cardiovascular Disease

## 2024-09-17 ENCOUNTER — Telehealth: Payer: Self-pay | Admitting: Pharmacist

## 2024-09-17 ENCOUNTER — Encounter: Payer: Self-pay | Admitting: Cardiovascular Disease

## 2024-09-17 ENCOUNTER — Ambulatory Visit: Payer: Self-pay | Attending: Cardiovascular Disease | Admitting: Cardiovascular Disease

## 2024-09-17 VITALS — BP 116/64 | Ht 67.0 in | Wt 159.0 lb

## 2024-09-17 DIAGNOSIS — E785 Hyperlipidemia, unspecified: Secondary | ICD-10-CM

## 2024-09-17 DIAGNOSIS — I10 Essential (primary) hypertension: Secondary | ICD-10-CM | POA: Diagnosis not present

## 2024-09-17 DIAGNOSIS — E782 Mixed hyperlipidemia: Secondary | ICD-10-CM | POA: Diagnosis not present

## 2024-09-17 DIAGNOSIS — I2510 Atherosclerotic heart disease of native coronary artery without angina pectoris: Secondary | ICD-10-CM

## 2024-09-17 DIAGNOSIS — R079 Chest pain, unspecified: Secondary | ICD-10-CM

## 2024-09-17 MED ORDER — REPATHA SURECLICK 140 MG/ML ~~LOC~~ SOAJ: 140.0000 mg | SUBCUTANEOUS | 0 refills | Status: AC

## 2024-09-17 NOTE — Patient Instructions (Signed)

## 2024-09-17 NOTE — Telephone Encounter (Signed)
 Waiting on PA for PCSK9i. Has been off Praluent  since November. PA pending. Will give 1 Repatha  sample/.

## 2024-09-17 NOTE — Progress Notes (Signed)
 "     09/17/2024 Alexis Black   May 06, 1961  [id number]  Primary Physician Yolande Toribio MATSU, MD Primary Cardiologist: Dorn JINNY Lesches MD GENI CODY MADEIRA, FSCAI  HPI:  Alexis Black is a 64 y.o.  mildly overweight married Caucasian female mother of 1, grandmother 1 grandchild who currently does not work.  She was referred to me by Dr. Jakie, her PCP, for evaluation of chest pain.  Her mother, Alexis Black, was also a patient of mine who passed away in 2013/09/26.  She is accompanied by her husband Alexis Black who is also a patient of mine.  I last saw her in the office 05/26/2022 her risk factors include treated hypertension and type 2 diabetes, untreated hyperlipidemia because of statin intolerance and family history with a father who had stents.  She is never had a heart attack or stroke.  She has lost 55 pounds over the last several months after stopping starting Ozempic .  She walks on occasion but is not particularly active.  She had an episode of chest pain back in December as well as in February.  She was admitted for 1 day in 2010/09/26 for chest pain and had a negative Myoview  stress test at that time.     She saw Almarie Crate nurse practitioner who she described several episodes of chest pain to.  Based on this a heart cath was recommended however the patient never pursued this.  She is back here in follow-up.  Based on her symptoms, risk factors and CT results I recommended that she proceed with outpatient radial diagnostic coronary catheterization.   I performed outpatient radial diagnostic cath on her 05/17/2022 which revealed mild nonobstructive disease.  Since that time her chest pain has resolved.  Her lipid profile does however show significant hypertriglyceridemia with a triglyceride level of 457.  Since I saw her a little over 2 years ago she continues to do well.  She is asymptomatic.  Her lipid profile was markedly improved on Praluent  prescriber Dr. Mona however her insurance company  denied that recently and we will need to reorder to see if her new [employer] will authorize.   Active Medications[1]   Allergies[2]  Social History   Socioeconomic History   Marital status: Married    Spouse name: Not on file   Number of children: 1   Years of education: Not on file   Highest education level: Not on file  Occupational History   Not on file  Tobacco Use   Smoking status: Never   Smokeless tobacco: Never  Vaping Use   Vaping status: Never Used  Substance and Sexual Activity   Alcohol  use: No   Drug use: No   Sexual activity: Not on file  Other Topics Concern   Not on file  Social History Narrative   Not on file   Social Drivers of Health   Tobacco Use: Low Risk (09/17/2024)   Patient History    Smoking Tobacco Use: Never    Smokeless Tobacco Use: Never    Passive Exposure: Not on file  Financial Resource Strain: Medium Risk (05/25/2023)   Received from [facility]   Overall Financial Resource Strain (CARDIA)    Difficulty of Paying Living Expenses: Somewhat hard  Food Insecurity: No Food Insecurity (06/07/2023)   Received from [facility]   Epic    Within the past 12 months, you worried that your food would run out before you got the money  to buy more.: Never true    Within the past 12 months, the food you bought just didn't last and you didn't have money to get more.: Never true  Transportation Needs: No Transportation Needs (05/25/2023)   Received from [facility] Transportation    In the past 12 months, has lack of transportation kept you from medical appointments or from getting medications?: No    Lack of Transportation (Non-Medical): No  Physical Activity: Not on file  Stress: Not on file  Social Connections: Not on file  Intimate Partner Violence: Not on file  Depression (EYV7-0): Not on file  Alcohol  Screen: Not on file  Housing: Unknown (05/25/2023)   Received  from [facility]   Epic    At any time in the past 12 months, were you homeless or living in a shelter (including now)?: No    In the last 12 months, was there a time when you were not able to pay the mortgage or rent on time?: No    Number of Times Moved in the Last Year: Not on file  Utilities: Not At Risk (05/25/2023)   Received from [facility] Utilities    Threatened with loss of utilities: No  Health Literacy: Not on file     Review of Systems: General: negative for chills, fever, night sweats or weight changes.  Cardiovascular: negative for chest pain, dyspnea on exertion, edema, orthopnea, palpitations, paroxysmal nocturnal dyspnea or shortness of breath Dermatological: negative for rash Respiratory: negative for cough or wheezing Urologic: negative for hematuria Abdominal: negative for nausea, vomiting, diarrhea, bright red blood per rectum, melena, or hematemesis Neurologic: negative for visual changes, syncope, or dizziness All other systems reviewed and are otherwise negative except as noted above.    Blood pressure 116/64, height 5' 7 (1.702 m), weight 159 lb (72.1 kg), SpO2 100%.  General appearance: alert and no distress Neck: no adenopathy, no carotid bruit, no JVD, supple, symmetrical, trachea midline, and thyroid  not enlarged, symmetric, no tenderness/mass/nodules Lungs: clear to auscultation bilaterally Heart: regular rate and rhythm, S1, S2 normal, no murmur, click, rub or gallop Extremities: extremities normal, atraumatic, no cyanosis or edema Pulses: 2+ and symmetric Skin: Skin color, texture, turgor normal. No rashes or lesions Neurologic: Grossly normal  EKG EKG Interpretation Date/Time:  Monday September 17 2024 14:37:25 EST Ventricular Rate:  89 PR Interval:  196 QRS Duration:  82 QT Interval:  366 QTC Calculation: 445 R Axis:   -11  Text Interpretation: Normal sinus rhythm Low voltage QRS Cannot rule out  Anterior infarct , age undetermined When compared with ECG of 21-Sep-2021 07:18, PR interval has decreased Confirmed by Court Carrier [id number]) on 09/17/2024 2:47:08 PM    ASSESSMENT AND PLAN:   Hyperlipidemia History of hyperlipidemia and hypertriglyceridemia intolerant to statin therapy on Praluent  in the past with recent lipid profile performed 09/11/2024 revealing total cholesterol 185, LDL 109 and HDL of 34.  Of note, she has been off her Praluent  for the last couple months because of insurance issues but now she has new insurance and will reapply.  Her triglycerides have been over thousand in the past but more recently on 09/11/2024 her triglyceride level was 245 on Vascepa .  Essential hypertension History of essential hypertension blood pressure measured today at 116/64.  She is on amlodipine, metoprolol , Micardis/hydrochlorothiazide .  Chest pain of uncertain etiology History of chest pain in the past with coronary calcium score performed 01/08/2022  which was 196.  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     Dorn DOROTHA Lesches MD FACP,FACC,FAHA, FSCAI 09/17/2024 2:59 PM    [1]  Current Meds  Medication Sig   amLODipine (NORVASC) 5 MG tablet Take 5 mg by mouth daily.   Coenzyme Q10 100 MG capsule Take 100 mg by mouth daily.   Continuous Glucose Receiver (DEXCOM G7 RECEIVER) DEVI See admin instructions.   Continuous Glucose Receiver (DEXCOM G7 RECEIVER) DEVI See admin instructions.   icosapent  Ethyl (VASCEPA ) 1 g capsule Take 2 capsules (2 g total) by mouth 2 (two) times daily.   JARDIANCE 25 MG TABS tablet Take 25 mg by mouth daily.   lidocaine  (LIDODERM ) 5 % Place 1 patch onto the skin daily.   metFORMIN  (GLUCOPHAGE ) 500 MG tablet Take 1,000 mg by mouth 2 (two) times daily.   methylphenidate  (METADATE   CD) 20 MG CR capsule Take 1 capsule (20 mg total) by mouth every morning before breakfast. (Patient taking differently: Take 20 mg by mouth every morning. Patient takes 30 mg)   metoprolol  succinate (TOPROL -XL) 50 MG 24 hr tablet Take 50 mg by mouth at bedtime.   Multiple Vitamins-Minerals (MULTIVITAMIN WITH MINERALS) tablet Take 1 tablet by mouth daily.   ondansetron  (ZOFRAN ) 4 MG tablet Take 1 tablet (4 mg total) by mouth every 8 (eight) hours as needed for nausea or vomiting.   RABEprazole (ACIPHEX) 20 MG tablet Take 20 mg by mouth daily.   telmisartan-hydrochlorothiazide  (MICARDIS HCT) 80-12.5 MG tablet Take 1 tablet by mouth daily.   tirzepatide  (MOUNJARO ) 15 MG/0.5ML Pen Inject 15 mg into the skin every 7 (seven) days.   Current Facility-Administered Medications for the 09/17/24 encounter (Office Visit) with Lesches Dorn PARAS, MD  Medication   sodium chloride  flush (NS) 0.9 % injection 3 mL  [2]  Allergies Allergen Reactions   Actos [Pioglitazone] Other (See Comments)    bloated   Crestor [Rosuvastatin]     myalgias   Pravachol [Pravastatin]     Other reaction(s): had muscle cramps   Quinapril Hcl     Other reaction(s): Unknown   Trulicity [Dulaglutide] Other (See Comments)    Other reaction(s): nausea with trulicity   "

## 2024-09-17 NOTE — Assessment & Plan Note (Signed)
 History of essential hypertension blood pressure measured today at 116/64.  She is on amlodipine, metoprolol , Micardis/hydrochlorothiazide .

## 2024-09-17 NOTE — Assessment & Plan Note (Signed)
 History of hyperlipidemia and hypertriglyceridemia intolerant to statin therapy on Praluent  in the past with recent lipid profile performed 09/11/2024 revealing total cholesterol 185, LDL 109 and HDL of 34.  Of note, she has been off her Praluent  for the last couple months because of insurance issues but now she has new insurance and will reapply.  Her triglycerides have been over thousand in the past but more recently on 09/11/2024 her triglyceride level was 245 on Vascepa .

## 2024-09-17 NOTE — Assessment & Plan Note (Signed)
 History of chest pain in the past with coronary calcium score performed 01/08/2022 which was 196.  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

## 2024-09-19 ENCOUNTER — Telehealth: Payer: Self-pay | Admitting: Pharmacy Technician

## 2024-09-19 ENCOUNTER — Other Ambulatory Visit (HOSPITAL_COMMUNITY): Payer: Self-pay

## 2024-09-19 NOTE — Telephone Encounter (Signed)
 Insurance prefers repatha    Pharmacy Patient Advocate Encounter   Received notification from cc chart that prior authorization for repatha  is required/requested.   Insurance verification completed.   The patient is insured through [facility].   Per test claim: PA required; PA submitted to above mentioned insurance via Latent Key/confirmation #/EOC [id number] Status is pending

## 2024-09-20 NOTE — Telephone Encounter (Signed)
 Pharmacy Patient Advocate Encounter  Received notification from [facility] that Prior Authorization for repatha  has been APPROVED from 09/20/24 to 09/19/27   PA #/Case ID/Reference #: [id number]

## 2024-10-03 ENCOUNTER — Telehealth: Payer: Self-pay | Admitting: Internal Medicine

## 2024-10-03 MED ORDER — ICOSAPENT ETHYL 1 G PO CAPS: 2.0000 g | ORAL_CAPSULE | Freq: Two times a day (BID) | ORAL | 3 refills | Status: AC

## 2024-10-03 NOTE — Telephone Encounter (Signed)
 Refill sent

## 2024-10-03 NOTE — Telephone Encounter (Signed)
" °*  STAT* If patient is at the pharmacy, call can be transferred to refill team.   1. Which medications need to be refilled? (please list name of each medication and dose if known)  icosapent  Ethyl (VASCEPA ) 1 g capsule  2. Which pharmacy/location (including street and city if local pharmacy) is medication to be sent to? [facility] - [place], [place] - [address]   3. Do they need a 30 day or 90 day supply? 90 day   Pt is out of medication   "

## 2025-02-18 ENCOUNTER — Encounter (INDEPENDENT_AMBULATORY_CARE_PROVIDER_SITE_OTHER): Admitting: Ophthalmology
